# Patient Record
Sex: Female | Born: 1945 | Race: White | Hispanic: No | Marital: Married | Smoking: Former smoker
Health system: Southern US, Community
[De-identification: ages and names within clinical notes are randomized; demographics above are authoritative.]

## PROBLEM LIST (undated history)

## (undated) DIAGNOSIS — F32A Depression, unspecified: Secondary | ICD-10-CM

## (undated) DIAGNOSIS — G309 Alzheimer's disease, unspecified: Secondary | ICD-10-CM

## (undated) DIAGNOSIS — F329 Major depressive disorder, single episode, unspecified: Secondary | ICD-10-CM

## (undated) DIAGNOSIS — E1149 Type 2 diabetes mellitus with other diabetic neurological complication: Secondary | ICD-10-CM

## (undated) DIAGNOSIS — M19049 Primary osteoarthritis, unspecified hand: Secondary | ICD-10-CM

## (undated) DIAGNOSIS — E559 Vitamin D deficiency, unspecified: Secondary | ICD-10-CM

## (undated) DIAGNOSIS — E049 Nontoxic goiter, unspecified: Secondary | ICD-10-CM

## (undated) DIAGNOSIS — E785 Hyperlipidemia, unspecified: Secondary | ICD-10-CM

## (undated) DIAGNOSIS — F0280 Dementia in other diseases classified elsewhere without behavioral disturbance: Secondary | ICD-10-CM

## (undated) DIAGNOSIS — E1129 Type 2 diabetes mellitus with other diabetic kidney complication: Secondary | ICD-10-CM

## (undated) DIAGNOSIS — I10 Essential (primary) hypertension: Secondary | ICD-10-CM

## (undated) HISTORY — DX: Vitamin D deficiency, unspecified: E55.9

## (undated) HISTORY — DX: Type 2 diabetes mellitus with other diabetic neurological complication: E11.49

## (undated) HISTORY — DX: Hyperlipidemia, unspecified: E78.5

## (undated) HISTORY — DX: Type 2 diabetes mellitus with other diabetic kidney complication: E11.29

## (undated) HISTORY — DX: Alzheimer's disease, unspecified: G30.9

## (undated) HISTORY — DX: Primary osteoarthritis, unspecified hand: M19.049

## (undated) HISTORY — PX: TONSILLECTOMY: SUR1361

## (undated) HISTORY — DX: Depression, unspecified: F32.A

## (undated) HISTORY — DX: Dementia in other diseases classified elsewhere, unspecified severity, without behavioral disturbance, psychotic disturbance, mood disturbance, and anxiety: F02.80

## (undated) HISTORY — PX: CARDIAC CATHETERIZATION: SHX172

## (undated) HISTORY — DX: Nontoxic goiter, unspecified: E04.9

## (undated) HISTORY — DX: Essential (primary) hypertension: I10

## (undated) HISTORY — DX: Major depressive disorder, single episode, unspecified: F32.9

---

## 2012-05-25 ENCOUNTER — Ambulatory Visit: Payer: Self-pay | Admitting: Nephrology

## 2012-08-15 ENCOUNTER — Ambulatory Visit: Payer: Self-pay | Admitting: Internal Medicine

## 2012-08-25 ENCOUNTER — Ambulatory Visit (INDEPENDENT_AMBULATORY_CARE_PROVIDER_SITE_OTHER): Payer: 59 | Admitting: Internal Medicine

## 2012-08-25 ENCOUNTER — Encounter: Payer: Self-pay | Admitting: Internal Medicine

## 2012-08-25 VITALS — BP 140/78 | HR 60 | Temp 98.0°F | Ht 65.0 in | Wt 211.0 lb

## 2012-08-25 DIAGNOSIS — G309 Alzheimer's disease, unspecified: Secondary | ICD-10-CM

## 2012-08-25 DIAGNOSIS — E11319 Type 2 diabetes mellitus with unspecified diabetic retinopathy without macular edema: Secondary | ICD-10-CM | POA: Insufficient documentation

## 2012-08-25 DIAGNOSIS — E049 Nontoxic goiter, unspecified: Secondary | ICD-10-CM

## 2012-08-25 DIAGNOSIS — F0280 Dementia in other diseases classified elsewhere without behavioral disturbance: Secondary | ICD-10-CM | POA: Insufficient documentation

## 2012-08-25 DIAGNOSIS — E785 Hyperlipidemia, unspecified: Secondary | ICD-10-CM

## 2012-08-25 DIAGNOSIS — N058 Unspecified nephritic syndrome with other morphologic changes: Secondary | ICD-10-CM

## 2012-08-25 DIAGNOSIS — I10 Essential (primary) hypertension: Secondary | ICD-10-CM | POA: Insufficient documentation

## 2012-08-25 DIAGNOSIS — E1121 Type 2 diabetes mellitus with diabetic nephropathy: Secondary | ICD-10-CM

## 2012-08-25 DIAGNOSIS — E1139 Type 2 diabetes mellitus with other diabetic ophthalmic complication: Secondary | ICD-10-CM

## 2012-08-25 DIAGNOSIS — F329 Major depressive disorder, single episode, unspecified: Secondary | ICD-10-CM

## 2012-08-25 DIAGNOSIS — M19049 Primary osteoarthritis, unspecified hand: Secondary | ICD-10-CM | POA: Insufficient documentation

## 2012-08-25 DIAGNOSIS — E1129 Type 2 diabetes mellitus with other diabetic kidney complication: Secondary | ICD-10-CM

## 2012-08-25 NOTE — Progress Notes (Signed)
Subjective:    Patient ID: Sara Gilbert, female    DOB: October 04, 1945, 66 y.o.   MRN: [id number]  HPI Here with daughter Smith Robert from this area--moved to [place] for husband's job in 2001 He is retiring soon and they are moving back to this area Living with daughter  Has history of dementia--goes back 3 years Has had gradual progression--- sounds like classic Alzheimer's Going to [place] every other Thursday and every Monday Daughter lays out the clothes for her, needs prompting for cleaning and personal care. Hands on help for shower. Will use toilet independently but is incontinent at times of urine so uses diapers  Depression is an issue for some time Predated the dementia issues but had started with counselors and meds. Off prozac in may when daughter got involved. Seems not much better since back on it [place], [place], family Only intermittent depressed mood---just "feeling blue"  Diabetes goes back about 20 years Insulin since early in course-- 1996 Has had some hypoglycemia reactions--endocrinologist wanted her to take rapid acting insulin in evening Control has been okay as far as they know Diabetic retinopathy ~1998. Needed laser surgery Past foot ulcer---has healed  Known hypercholesterolemia High blood pressure also for many years Patient notes some vague history of CHF---but this has not been active Dr Clarene Duke did stress test in June---was negative  Diagnosed with goiter Had biopsy in past No meds for this  Current Outpatient Prescriptions on File Prior to Visit  Medication Sig Dispense Refill  . amLODipine-benazepril (LOTREL) 10-40 MG per capsule Take 1 capsule by mouth daily.      . Calcium Carbonate-Vitamin D (CALCIUM 600+D3 PO) Take by mouth daily.      . colesevelam (WELCHOL) 625 MG tablet Take 3,750 mg by mouth daily.      Marland Kitchen donepezil (ARICEPT) 10 MG tablet Take 10 mg by mouth at bedtime as needed.      . fenofibrate (TRICOR) 145 MG tablet  Take 145 mg by mouth daily.      . Ferrous Sulfate 140 (45 FE) MG TBCR Take 1 tablet by mouth daily.      Marland Kitchen FLUoxetine (PROZAC) 40 MG capsule Take 40 mg by mouth daily.      . insulin glargine (LANTUS) 100 UNIT/ML injection Inject 40 Units into the skin at bedtime.      . Memantine HCl ER (NAMENDA XR) 28 MG CP24 Take 1 capsule by mouth daily.      . metFORMIN (GLUCOPHAGE) 1000 MG tablet Take 1,000 mg by mouth 2 (two) times daily with a meal.      . pravastatin (PRAVACHOL) 40 MG tablet Take 40 mg by mouth daily.        Allergies  Allergen Reactions  . Tetanus Toxoids Swelling and Rash    Past Medical History  Diagnosis Date  . Type II or unspecified type diabetes mellitus with neurological manifestations, not stated as uncontrolled(250.60)   . Hypertension   . Type II or unspecified type diabetes mellitus with renal manifestations, not stated as uncontrolled(250.40)   . Hyperlipidemia   . Alzheimer's dementia 2010?  Marland Kitchen Goiter     Past Surgical History  Procedure Date  . Tonsillectomy     Family History  Problem Relation Age of Onset  . Dementia Mother   . Breast cancer Mother   . Hypertension Mother   . Coronary artery disease Father   . Hypertension Father   . Diabetes Father     History  Social History  . Marital Status: Married    Spouse Name: N/A    Number of Children: 1  . Years of Education: N/A   Occupational History  . 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 Smoking status: Former Smoker    Types: Cigarettes    Quit date: 09/21/1997  . Smokeless tobacco: Never Used  . Alcohol Use: No  . Drug Use: No  . Sexually Active: Not on file   Other Topics Concern  . Not on file   Social History Narrative   Lives with her daughterNo living willNo specific health care POA---but requests husband then daughterWould accept resuscitation attempts   Review of Systems  Constitutional: Negative for appetite change and unexpected weight  change.  HENT: Negative for hearing loss, dental problem and tinnitus.        Own teeth--regular with dentist  Eyes: Negative for photophobia and visual disturbance.  Respiratory: Negative for cough, chest tightness and shortness of breath.   Cardiovascular: Positive for leg swelling. Negative for chest pain and palpitations.       Some left ankle swelling  Gastrointestinal: Positive for blood in stool. Negative for nausea, vomiting and constipation.       Some heartburn--uses baking soda as needed. Not lately Some bright red blood after incontinent stool about 3 weeks ago---might have been from hemorrhoids(no recurrence)  Genitourinary: Negative for dysuria, urgency, frequency and difficulty urinating.  Musculoskeletal: Positive for arthralgias. Negative for back pain and joint swelling.       Arthritis in hands--no meds  Skin: Negative for rash.       No suspicious lesions but big moles on back  Neurological: Positive for headaches. Negative for dizziness, syncope and light-headedness.       Occ headaches  Hematological: Negative for adenopathy. Does not bruise/bleed easily.  Psychiatric/Behavioral: Positive for dysphoric mood and decreased concentration. Negative for sleep disturbance. The patient is nervous/anxious.        Objective:   Physical Exam  Constitutional: She appears well-developed and well-nourished. No distress.  Neck: Normal range of motion. Neck supple. No thyromegaly present.  Cardiovascular: Normal rate, regular rhythm, normal heart sounds and intact distal pulses.  Exam reveals no gallop.   No murmur heard. Pulmonary/Chest: Effort normal and breath sounds normal. No respiratory distress. She has no wheezes. She has no rales.  Abdominal: Soft. There is no tenderness.  Musculoskeletal: She exhibits no tenderness.       Trace ankle edema  Lymphadenopathy:    She has no cervical adenopathy.  Neurological: She exhibits normal muscle tone.       No focal weakness   Skin:       Some irritation under abdominal pannus  Psychiatric: She has a normal mood and affect. Her behavior is normal.          Assessment & Plan:

## 2012-08-25 NOTE — Assessment & Plan Note (Signed)
Will continue statin but stop other meds (no clear evidence of positive effect)

## 2012-08-25 NOTE — Assessment & Plan Note (Signed)
Mood is not consistently bad Not anhedonic Will continue the fluoxetine

## 2012-08-25 NOTE — Assessment & Plan Note (Signed)
Seeing Dr Wynelle Link

## 2012-08-25 NOTE — Assessment & Plan Note (Signed)
Will check labs Stop humalog due to hypoglycemia

## 2012-08-25 NOTE — Assessment & Plan Note (Signed)
BP Readings from Last 3 Encounters:  08/25/12 140/78   Good control No changes Will leave met b and CBC checks to Dr Wynelle Link

## 2012-08-25 NOTE — Assessment & Plan Note (Signed)
Mild to moderate Unsure how much help the meds are Will continue for now

## 2012-08-26 LAB — LIPID PANEL
HDL: 49.7 mg/dL (ref >39.00)
LDL Cholesterol: 94 mg/dL (ref 0–99)
Total CHOL/HDL Ratio: 3
VLDL: 28.4 mg/dL (ref 0.0–40.0)

## 2012-08-26 LAB — HEPATIC FUNCTION PANEL
ALT: 22 U/L (ref 0–35)
AST: 20 U/L (ref 0–37)
Albumin: 2.8 g/dL — ABNORMAL LOW (ref 3.5–5.2)
Alkaline Phosphatase: 50 U/L (ref 39–117)
Bilirubin, Direct: 0.1 mg/dL (ref 0.0–0.3)
Total Bilirubin: 0.4 mg/dL (ref 0.3–1.2)
Total Protein: 6.2 g/dL (ref 6.0–8.3)

## 2012-08-26 LAB — T4, FREE: Free T4: 0.92 ng/dL (ref 0.60–1.60)

## 2012-08-31 ENCOUNTER — Encounter: Payer: Self-pay | Admitting: *Deleted

## 2012-09-05 ENCOUNTER — Other Ambulatory Visit: Payer: Self-pay | Admitting: Internal Medicine

## 2012-09-13 ENCOUNTER — Encounter: Payer: Self-pay | Admitting: Internal Medicine

## 2012-10-10 ENCOUNTER — Emergency Department: Payer: Self-pay | Admitting: Unknown Physician Specialty

## 2012-10-10 LAB — CBC
HCT: 42.6 % (ref 35.0–47.0)
HGB: 14.1 g/dL (ref 12.0–16.0)
MCHC: 33.1 g/dL (ref 32.0–36.0)
Platelet: 252 10*3/uL (ref 150–440)
RBC: 4.77 10*6/uL (ref 3.80–5.20)
RDW: 13.2 % (ref 11.5–14.5)
WBC: 9.5 10*3/uL (ref 3.6–11.0)

## 2012-10-10 LAB — COMPREHENSIVE METABOLIC PANEL
Albumin: 2.7 g/dL — ABNORMAL LOW (ref 3.4–5.0)
Anion Gap: 5 — ABNORMAL LOW (ref 7–16)
BUN: 27 mg/dL — ABNORMAL HIGH (ref 7–18)
Calcium, Total: 9.4 mg/dL (ref 8.5–10.1)
Co2: 32 mmol/L (ref 21–32)
EGFR (African American): 54 — ABNORMAL LOW
EGFR (Non-African Amer.): 47 — ABNORMAL LOW
Glucose: 134 mg/dL — ABNORMAL HIGH (ref 65–99)
Potassium: 4.3 mmol/L (ref 3.5–5.1)
SGPT (ALT): 21 U/L (ref 12–78)
Sodium: 143 mmol/L (ref 136–145)

## 2012-10-10 LAB — URINALYSIS, COMPLETE
Glucose,UR: >=500 mg/dL (ref 0–75)
Hyaline Cast: 3
Ketone: NEGATIVE
Nitrite: NEGATIVE
RBC,UR: 3 /HPF (ref 0–5)
Specific Gravity: 1.02 (ref 1.003–1.030)
WBC UR: 6 /HPF (ref 0–5)

## 2012-10-10 LAB — TSH: Thyroid Stimulating Horm: 3.43 u[IU]/mL

## 2012-10-10 LAB — TROPONIN I: Troponin-I: <0.02 ng/mL

## 2012-10-10 LAB — RAPID INFLUENZA A&B ANTIGENS

## 2012-10-25 ENCOUNTER — Telehealth: Payer: Self-pay

## 2012-10-25 NOTE — Telephone Encounter (Signed)
Sara Gilbert with [facility] left v/m;pts daughter,Adrian request hospice services for pt. Pt has appt to see Dr Alphonsus Sias on 10/26/12. If Dr Alphonsus Sias is agreeable please fax order for hospice care, demographics and 10/26/12 office note.

## 2012-10-25 NOTE — Telephone Encounter (Signed)
I will discuss this with them tomorrow

## 2012-10-26 ENCOUNTER — Other Ambulatory Visit: Payer: Self-pay | Admitting: *Deleted

## 2012-10-26 ENCOUNTER — Encounter: Payer: Self-pay | Admitting: Internal Medicine

## 2012-10-26 ENCOUNTER — Ambulatory Visit (INDEPENDENT_AMBULATORY_CARE_PROVIDER_SITE_OTHER): Payer: Medicare Other | Admitting: Internal Medicine

## 2012-10-26 VITALS — BP 130/70 | HR 55 | Temp 98.3°F | Wt 198.0 lb

## 2012-10-26 DIAGNOSIS — G309 Alzheimer's disease, unspecified: Secondary | ICD-10-CM

## 2012-10-26 DIAGNOSIS — F0280 Dementia in other diseases classified elsewhere without behavioral disturbance: Secondary | ICD-10-CM

## 2012-10-26 DIAGNOSIS — F329 Major depressive disorder, single episode, unspecified: Secondary | ICD-10-CM

## 2012-10-26 DIAGNOSIS — E1129 Type 2 diabetes mellitus with other diabetic kidney complication: Secondary | ICD-10-CM

## 2012-10-26 DIAGNOSIS — I10 Essential (primary) hypertension: Secondary | ICD-10-CM

## 2012-10-26 MED ORDER — BLOOD GLUCOSE TEST VI STRP: ORAL_STRIP | Status: DC

## 2012-10-26 MED ORDER — METFORMIN HCL 1000 MG PO TABS: 1000.0000 mg | ORAL_TABLET | Freq: Two times a day (BID) | ORAL | Status: DC

## 2012-10-26 MED ORDER — GLUCOSE BLOOD VI STRP: ORAL_STRIP | Status: DC

## 2012-10-26 MED ORDER — INSULIN GLARGINE 100 UNIT/ML ~~LOC~~ SOLN: 40.0000 [IU] | Freq: Every day | SUBCUTANEOUS | Status: DC

## 2012-10-26 NOTE — Progress Notes (Signed)
Subjective:    Patient ID: Sara Gilbert, female    DOB: 1945/11/06, 67 y.o.   MRN: [id number]  HPI Here with husband and daughter  Called rescue 2 days ago "legs were jello"--unable to stand up Was able to use walker 3 days ago--then worsened  Didn't want to go to ER---did walk unstably with walker when EMTs there Not using walker correctly Now in wheelchair  Has had overall decline in status Not as responsive --"I don't know" is the answer to everything Uncontrolled drooling Now incontinent of bowel and bladder--rarely successful on bedside cammode Needs to be dressed and bathed now  Not clearly depressed Sleeping okay  No obvious pain but vague "not feeling well" Pointing to leg yesterday as possibly painful  Aide checking BP regularly 120/70 to 197/79 Many are 170-180 systolic  Current Outpatient Prescriptions on File Prior to Visit  Medication Sig Dispense Refill  . amLODipine-benazepril (LOTREL) 10-40 MG per capsule Take 1 capsule by mouth daily.      Marland Kitchen aspirin 81 MG tablet Take 81 mg by mouth daily.      . Calcium Carbonate-Vitamin D (CALCIUM 600+D3 PO) Take by mouth daily.      . Cholecalciferol (VITAMIN D3) 2000 UNITS capsule Take 2,000 Units by mouth daily.      Marland Kitchen donepezil (ARICEPT) 10 MG tablet TAKE 2 TABLETS BY MOUTH EVERY DAY  60 tablet  2  . Ferrous Sulfate 140 (45 FE) MG TBCR Take 1 tablet by mouth daily.      . fish oil-omega-3 fatty acids 1000 MG capsule Take 1 g by mouth daily.      Marland Kitchen FLUoxetine (PROZAC) 40 MG capsule Take 40 mg by mouth daily.      . insulin glargine (LANTUS) 100 UNIT/ML injection Inject 40 Units into the skin at bedtime.  10 mL  6  . metFORMIN (GLUCOPHAGE) 1000 MG tablet Take 1 tablet (1,000 mg total) by mouth 2 (two) times daily with a meal.  60 tablet  11  . Multiple Vitamin (MULTIVITAMIN) tablet Take 1 tablet by mouth daily.      Marland Kitchen NAMENDA XR 28 MG CP24 TAKE 1 TABLET BY MOUTH EVERY DAY  30 capsule  2  . pravastatin (PRAVACHOL)  40 MG tablet Take 40 mg by mouth daily.      . Vitamin D, Ergocalciferol, (DRISDOL) 50000 UNITS CAPS Take 50,000 Units by mouth every 30 (thirty) days.        Allergies  Allergen Reactions  . Tetanus Toxoids Swelling and Rash    Past Medical History  Diagnosis Date  . Type II or unspecified type diabetes mellitus with neurological manifestations, not stated as uncontrolled(250.60)   . Hypertension   . Type II or unspecified type diabetes mellitus with renal manifestations, not stated as uncontrolled(250.40)   . Hyperlipidemia   . Alzheimer's dementia 2010?  Marland Kitchen Goiter   . Depression   . Osteoarthritis, hand   . Vitamin D deficiency     Normal DEXA 11/09    Past Surgical History  Procedure Date  . Tonsillectomy     Family History  Problem Relation Age of Onset  . Dementia Mother   . Breast cancer Mother   . Hypertension Mother   . Coronary artery disease Father   . Hypertension Father   . Diabetes Father     History   Social History  . Marital Status: Married    Spouse Name: N/A    Number of Children: 1  .  Years of Education: N/A   Occupational History  . 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 Smoking status: Former Smoker    Types: Cigarettes    Quit date: 09/21/1997  . Smokeless tobacco: Never Used  . Alcohol Use: No  . Drug Use: No  . Sexually Active: Not on file   Other Topics Concern  . Not on file   Social History Narrative   Lives with her daughterNo living willNo specific health care POA---but requests husband then daughterWould accept resuscitation attempts   Review of Systems Not eating--even with direct feeding efforts Has lost 13# in just 2 months    Objective:   Physical Exam  Constitutional: No distress.       Clearly seems to have some wasting   Neck: Normal range of motion. Neck supple. No thyromegaly present.  Cardiovascular: Normal rate, regular rhythm and normal heart sounds.  Exam reveals no  gallop.   No murmur heard. Pulmonary/Chest: Effort normal and breath sounds normal. No respiratory distress. She has no wheezes. She has no rales.  Abdominal: Soft. There is no tenderness.  Lymphadenopathy:    She has no cervical adenopathy.  Neurological:       Distant Does respond with single word answers Equal strength in arms (4/5) and legs (3+/5) Normal tone Did have some saliva  Psychiatric:       Passive and some withdrawn          Assessment & Plan:

## 2012-10-26 NOTE — Assessment & Plan Note (Addendum)
Sugars okay No hypoglycemic reactions Will  cut back on insulin to 30

## 2012-10-26 NOTE — Assessment & Plan Note (Signed)
Hard to judge Will continue the fluoxetine

## 2012-10-26 NOTE — Assessment & Plan Note (Signed)
High by aide at home but not here BP Readings from Last 3 Encounters:  10/26/12 130/70  08/25/12 140/78   No changes for now

## 2012-10-26 NOTE — Assessment & Plan Note (Addendum)
Has had striking decline with loss of 10% of body weight Has lost all ADLs and totally dependent With early onset, this suggests rapid progression and likely death within 6 months Will ask hospice to see  Should get PT attempt at least Consider stopping donepezil

## 2012-10-27 ENCOUNTER — Telehealth: Payer: Self-pay | Admitting: *Deleted

## 2012-10-27 MED ORDER — GLUCOSE BLOOD VI STRP: ORAL_STRIP | Status: DC

## 2012-10-27 MED ORDER — INSULIN GLARGINE 100 UNIT/ML ~~LOC~~ SOLN: 30.0000 [IU] | Freq: Every day | SUBCUTANEOUS | Status: DC

## 2012-10-27 NOTE — Telephone Encounter (Signed)
Received fax from pharmacy stating that pt uses lantus solostar and not the vial, I need to know if they also need pen needles and what size?  Called daughter and left VM at 11:44am asking her to return my call Just called and left message for husband to return my call

## 2012-10-27 NOTE — Telephone Encounter (Signed)
Spoke with husband and corrected pt's rx  rx sent to pharmacy by e-script

## 2012-10-27 NOTE — Telephone Encounter (Signed)
pts husband left v/m returning call and request call back [phone].

## 2012-10-27 NOTE — Telephone Encounter (Signed)
Message copied by Sueanne Margarita on Thu Oct 27, 2012  3:28 PM ------      Message from: Patience Musca      Created: Thu Oct 27, 2012  2:38 PM       Pt's husband, Leonette Most left v/m returning Dee's call and requesting call back [phone].

## 2012-11-11 DIAGNOSIS — G309 Alzheimer's disease, unspecified: Secondary | ICD-10-CM

## 2012-11-11 DIAGNOSIS — F0280 Dementia in other diseases classified elsewhere without behavioral disturbance: Secondary | ICD-10-CM

## 2012-11-23 ENCOUNTER — Ambulatory Visit: Payer: Medicare Other | Admitting: Internal Medicine

## 2012-11-23 ENCOUNTER — Encounter: Payer: Self-pay | Admitting: Internal Medicine

## 2012-11-23 VITALS — BP 144/72 | HR 60 | Resp 14

## 2012-11-23 DIAGNOSIS — I10 Essential (primary) hypertension: Secondary | ICD-10-CM

## 2012-11-23 DIAGNOSIS — E1129 Type 2 diabetes mellitus with other diabetic kidney complication: Secondary | ICD-10-CM

## 2012-11-23 NOTE — Assessment & Plan Note (Signed)
The fluctuations in her status---better now--- and history of MRI lesions really does point to vascular or at least mixed dementia (daughter had indicated slow steady decline at my first visit) May not need meds Daughter, hospice RN will review with husband Can try serially try stopping the donepezil, then memantine, if he agrees Continue with trying the PT If her status stabilizes, may need to discharge from hospice

## 2012-11-23 NOTE — Progress Notes (Signed)
Subjective:    Patient ID: Sara Gilbert, female    DOB: [birth year], 67 y.o.   MRN: [id number]  HPI 1st home visit Daughter is here---friend is staying with her while husband in [place] RN is here from hospice  Variable status Today she is talking some but at times she is in bed and non communicative Daughter thinks she may be better in response to friend being here In wheelchair Has started some PT this week--has been able to walk some but very variable Totally incontinent--they do bring her to the bathroom regularly and this is occasionally successful (she never initiates) Feeds herself at times---family need to help much of the time  Checks sugars three to  four times per day Family has used this to help gauge how to feed her No apparent hypoglycemic reactions Highest was 250  No chest pain No SOB No syncope  Still has right hand tremor Reviewed Dr Daisy Blossom note  Current Outpatient Prescriptions on File Prior to Visit  Medication Sig Dispense Refill  . amLODipine-benazepril (LOTREL) 10-40 MG per capsule Take 1 capsule by mouth daily.      Marland Kitchen donepezil (ARICEPT) 10 MG tablet TAKE 2 TABLETS BY MOUTH EVERY DAY  60 tablet  2  . Ferrous Sulfate 140 (45 FE) MG TBCR Take 1 tablet by mouth daily.      . fish oil-omega-3 fatty acids 1000 MG capsule Take 1 g by mouth daily.      Marland Kitchen FLUoxetine (PROZAC) 40 MG capsule Take 40 mg by mouth daily.      Marland Kitchen glucose blood (FORA V12 BLOOD GLUCOSE TEST) test strip Use as directed to test blood sugar once daily dx 250.40  100 each  0  . insulin glargine (LANTUS SOLOSTAR) 100 UNIT/ML injection Inject 30 Units into the skin at bedtime. Dx 250.40  5 pen  11  . metFORMIN (GLUCOPHAGE) 1000 MG tablet Take 1 tablet (1,000 mg total) by mouth 2 (two) times daily with a meal.  60 tablet  11  . Multiple Vitamin (MULTIVITAMIN) tablet Take 1 tablet by mouth daily.      Marland Kitchen NAMENDA XR 28 MG CP24 TAKE 1 TABLET BY MOUTH EVERY DAY  30 capsule  2  .  pravastatin (PRAVACHOL) 40 MG tablet Take 40 mg by mouth daily.      . Vitamin D, Ergocalciferol, (DRISDOL) 50000 UNITS CAPS Take 50,000 Units by mouth every 30 (thirty) days.       No current facility-administered medications on file prior to visit.    Allergies  Allergen Reactions  . Tetanus Toxoids Swelling and Rash    Past Medical History  Diagnosis Date  . Type II or unspecified type diabetes mellitus with neurological manifestations, not stated as uncontrolled(250.60)   . Hypertension   . Type II or unspecified type diabetes mellitus with renal manifestations, not stated as uncontrolled(250.40)   . Hyperlipidemia   . Alzheimer's dementia 2010?  Marland Kitchen Goiter   . Depression   . Osteoarthritis, hand   . Vitamin D deficiency     Normal DEXA 11/09    Past Surgical History  Procedure Laterality Date  . Tonsillectomy      Family History  Problem Relation Age of Onset  . Dementia Mother   . Breast cancer Mother   . Hypertension Mother   . Coronary artery disease Father   . Hypertension Father   . Diabetes Father     History   Social History  . Marital Status: Married  Spouse Name: N/A    Number of Children: 1  . Years of Education: N/A   Occupational History  . 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 Smoking status: Former Smoker    Types: Cigarettes    Quit date: 09/21/1997  . Smokeless tobacco: Never Used  . Alcohol Use: No  . Drug Use: No  . Sexually Active: Not on file   Other Topics Concern  . Not on file   Social History Narrative   Lives with her daughter   No living will   No specific health care POA---but requests husband then daughter   Would accept resuscitation attempts   Review of Systems Appetite fair if she is fed---not much if she feeds herself Sleeps okay No mood problems---crying, lability, etc    Objective:   Physical Exam  Constitutional: She appears well-developed and well-nourished. No distress.   Neck: Normal range of motion. No thyromegaly present.  Cardiovascular: Normal rate, regular rhythm, normal heart sounds and intact distal pulses.  Exam reveals no gallop.   No murmur heard. Pulmonary/Chest: Effort normal and breath sounds normal. No respiratory distress. She has no wheezes. She has no rales.  Abdominal: Soft. There is no tenderness.  Lymphadenopathy:    She has no cervical adenopathy.  Neurological:  Does have brief socially appropriate answers  Skin:  No foot ulcers   Psychiatric: She has a normal mood and affect. Her behavior is normal.          Assessment & Plan:

## 2012-11-23 NOTE — Assessment & Plan Note (Signed)
Given the possibility that her dementia is all or partially vascular in nature--will continue the statin

## 2012-11-23 NOTE — Assessment & Plan Note (Signed)
Mood seems to be stable May have different behavior for her husband Will continue the med at current dose for now ?could this be related to tremor Will try holding 2 days per week

## 2012-11-23 NOTE — Assessment & Plan Note (Signed)
BP Readings from Last 3 Encounters:  11/23/12 144/72  10/26/12 130/70  08/25/12 140/78   Generally acceptable control No changes needed

## 2012-11-23 NOTE — Assessment & Plan Note (Signed)
Seems to have good control Discussed that they can decrease checking to once or twice a day Lab Results  Component Value Date   HGBA1C 6.4 08/25/2012

## 2012-12-01 ENCOUNTER — Other Ambulatory Visit: Payer: Self-pay | Admitting: Internal Medicine

## 2012-12-24 ENCOUNTER — Other Ambulatory Visit: Payer: Self-pay | Admitting: Internal Medicine

## 2012-12-26 ENCOUNTER — Telehealth: Payer: Self-pay | Admitting: Family Medicine

## 2012-12-26 NOTE — Telephone Encounter (Signed)
Ok to fill? Home visit patient

## 2012-12-26 NOTE — Telephone Encounter (Signed)
Error

## 2012-12-26 NOTE — Telephone Encounter (Signed)
Okay to send for a year 

## 2012-12-26 NOTE — Telephone Encounter (Signed)
rx sent to pharmacy by e-script  

## 2013-01-02 ENCOUNTER — Telehealth: Payer: Self-pay | Admitting: *Deleted

## 2013-01-02 NOTE — Telephone Encounter (Signed)
Prior auth being requested for METOPROLOL TARTRATE 25 MG TAB TAKE 1 TAB BY MOUTH TWICE DAILY, should I send for prior auth form? We have never filled this, please advise

## 2013-01-02 NOTE — Telephone Encounter (Signed)
Yes Very inexpensive though,  Don't know why we have to do request

## 2013-01-10 DIAGNOSIS — E1365 Other specified diabetes mellitus with hyperglycemia: Secondary | ICD-10-CM

## 2013-01-10 DIAGNOSIS — F0280 Dementia in other diseases classified elsewhere without behavioral disturbance: Secondary | ICD-10-CM

## 2013-01-11 ENCOUNTER — Telehealth: Payer: Self-pay | Admitting: Internal Medicine

## 2013-01-11 NOTE — Telephone Encounter (Signed)
No need to be done now

## 2013-01-11 NOTE — Telephone Encounter (Signed)
Discussed with Sara Gilbert Only tinged mucus Will try fluconazole 150mg  daily x 2 1% hydrocortisone cream under the fanny cream they use now   Also, will be moving to the [place] [facility] [place] soon She will let me know

## 2013-01-11 NOTE — Telephone Encounter (Signed)
Caller: [name]/Other; Phone: [phone]; Reason for Call: Hospice is calling regarding thick, mucus discharge found in her pull ups.  Labia reddened.  Denies itching.  Reports there has been a blood tinge to the discharge at times.  Sara Gilbert is wondering if Dr.  Alphonsus Sias would prescribe something for either a UTI or a yeast infection.  Please call her back to discuss patient status.  Thanks.

## 2013-01-13 ENCOUNTER — Telehealth: Payer: Self-pay | Admitting: *Deleted

## 2013-01-13 NOTE — Telephone Encounter (Signed)
Lmtcb pharmacy requests received for metoprolol tartrate 25 mg BID and Amlodipine benazepril 10-40 mg qd. Don't see pt has been seen before.

## 2013-01-16 NOTE — Telephone Encounter (Signed)
x2 lmtcb regarding refill pt never seen here.

## 2013-02-06 ENCOUNTER — Telehealth: Payer: Self-pay | Admitting: Internal Medicine

## 2013-02-06 NOTE — Telephone Encounter (Signed)
Pt's husband called in to let you know the address has changed.  I have updated the file, but wasn't sure when your next home visit is, so I wanted to be sure you were aware. Thanks.

## 2013-02-06 NOTE — Telephone Encounter (Signed)
Thanks The hospice nurse had let me know.  They are at the [facility] apartments--- at [facility]. I know where that is very well

## 2013-02-15 DIAGNOSIS — F0280 Dementia in other diseases classified elsewhere without behavioral disturbance: Secondary | ICD-10-CM

## 2013-02-15 DIAGNOSIS — G309 Alzheimer's disease, unspecified: Secondary | ICD-10-CM

## 2013-02-17 ENCOUNTER — Ambulatory Visit (INDEPENDENT_AMBULATORY_CARE_PROVIDER_SITE_OTHER): Payer: Medicare Other | Admitting: Cardiovascular Disease

## 2013-02-17 ENCOUNTER — Encounter: Payer: Self-pay | Admitting: Cardiovascular Disease

## 2013-02-17 VITALS — BP 122/78 | HR 64 | Ht 66.0 in | Wt 184.3 lb

## 2013-02-17 DIAGNOSIS — E1129 Type 2 diabetes mellitus with other diabetic kidney complication: Secondary | ICD-10-CM

## 2013-02-17 DIAGNOSIS — E785 Hyperlipidemia, unspecified: Secondary | ICD-10-CM

## 2013-02-17 DIAGNOSIS — I10 Essential (primary) hypertension: Secondary | ICD-10-CM

## 2013-02-17 MED ORDER — AMLODIPINE BESY-BENAZEPRIL HCL 10-40 MG PO CAPS: 1.0000 | ORAL_CAPSULE | Freq: Every day | ORAL | Status: DC

## 2013-02-17 MED ORDER — METOPROLOL TARTRATE 25 MG PO TABS: 25.0000 mg | ORAL_TABLET | Freq: Two times a day (BID) | ORAL | Status: DC

## 2013-02-17 MED ORDER — PRAVASTATIN SODIUM 40 MG PO TABS: 40.0000 mg | ORAL_TABLET | Freq: Every day | ORAL | Status: DC

## 2013-02-17 NOTE — Assessment & Plan Note (Signed)
Cholesterol is at goal on the current lipid regimen. No changes to the medications were made. No known coronary artery disease.

## 2013-02-17 NOTE — Assessment & Plan Note (Signed)
Diabetes well controlled. Managed by Dr. Alphonsus Sias.

## 2013-02-17 NOTE — Patient Instructions (Addendum)
You are doing well. No medication changes were made.  Please call us if you have new issues that need to be addressed before your next appt.  Your physician wants you to follow-up in: 12 months.  You will receive a reminder letter in the mail two months in advance. If you don't receive a letter, please call our office to schedule the follow-up appointment. 

## 2013-02-17 NOTE — Progress Notes (Signed)
Patient ID: Sara Gilbert, female    DOB: 28-Feb-1946, 67 y.o.   MRN: [id number]  HPI Comments: Sara Gilbert is a very pleasant 67 year old woman with dementia, smoking history for 25 years who stopped in 1995, long history of diabetes, prior patient of Dr. Mindi Junker little who presents for new patient evaluation.  She presents with her husband. She's not reactive, has significant gait instability secondary to underlying dementia. She's had weight loss over the past year or so as she is eating less. Notes provided indicates she has hypertension and hyperlipidemia.   She had a stress test August 2013 for shortness of breath episodes. This suggested mild ischemia in the mid anterolateral and apical lateral regions. Low risk scan. Unable to exclude breast attenuation artifact. Ejection fraction 71%  Husband reports that she has no recent symptoms that she has expressed to him. She has significant proteinuria, creatinine 0.94 Blood pressure is typically well controlled at home  EKG today shows normal sinus rhythm with rate 64 beats per minute with no significant ST or T wave changes   Outpatient Encounter Prescriptions as of 02/17/2013  Medication Sig Dispense Refill  . amLODipine-benazepril (LOTREL) 10-40 MG per capsule Take 1 capsule by mouth daily.  30 capsule  6  . aspirin 81 MG chewable tablet Chew 81 mg by mouth daily.      . Ferrous Sulfate 140 (45 FE) MG TBCR Take 1 tablet by mouth daily.      . fish oil-omega-3 fatty acids 1000 MG capsule Take 1 g by mouth daily.      Marland Kitchen FLUoxetine (PROZAC) 40 MG capsule Take 40 mg by mouth daily.      Marland Kitchen glucose blood (FORA V12 BLOOD GLUCOSE TEST) test strip Use as directed to test blood sugar once daily dx 250.40  100 each  0  . insulin glargine (LANTUS SOLOSTAR) 100 UNIT/ML injection Inject 30 Units into the skin at bedtime. Dx 250.40  5 pen  11  . metFORMIN (GLUCOPHAGE) 1000 MG tablet Take 1 tablet (1,000 mg total) by mouth 2 (two) times daily with a  meal.  60 tablet  11  . metoprolol tartrate (LOPRESSOR) 25 MG tablet Take 1 tablet (25 mg total) by mouth 2 (two) times daily.  60 tablet  6  . Multiple Vitamin (MULTIVITAMIN) tablet Take 1 tablet by mouth daily.      Marland Kitchen NAMENDA XR 28 MG CP24 TAKE ONE CAPSULE BY MOUTH ONCE A DAY  30 capsule  11  . pravastatin (PRAVACHOL) 40 MG tablet Take 1 tablet (40 mg total) by mouth daily.  30 tablet  6  . prochlorperazine (COMPAZINE) 10 MG tablet Take 10 mg by mouth every 6 (six) hours as needed.      . Vitamin D, Ergocalciferol, (DRISDOL) 50000 UNITS CAPS Take 50,000 Units by mouth every 30 (thirty) days.      . Vitamin D, Ergocalciferol, (DRISDOL) 50000 UNITS CAPS Take 50,000 Units by mouth every 30 (thirty) days.        Review of Systems  Constitutional: Negative.   HENT: Negative.   Eyes: Negative.   Respiratory: Negative.   Cardiovascular: Negative.   Gastrointestinal: Negative.   Musculoskeletal: Negative.   Skin: Negative.   Neurological: Negative.   Psychiatric/Behavioral: Negative.   All other systems reviewed and are negative.    BP 122/78  Pulse 64  Ht 5\' 6"  (1.676 m)  Wt 184 lb 5 oz (83.604 kg)  BMI 29.76 kg/m2  Physical Exam  Nursing  note and vitals reviewed. Constitutional: She is oriented to person, place, and time. She appears well-developed and well-nourished.  HENT:  Head: Normocephalic.  Nose: Nose normal.  Mouth/Throat: Oropharynx is clear and moist.  Eyes: Conjunctivae are normal. Pupils are equal, round, and reactive to light.  Neck: Normal range of motion. Neck supple. No JVD present.  Cardiovascular: Normal rate, regular rhythm, S1 normal, S2 normal, normal heart sounds and intact distal pulses.  Exam reveals no gallop and no friction rub.   No murmur heard. Pulmonary/Chest: Effort normal and breath sounds normal. No respiratory distress. She has no wheezes. She has no rales. She exhibits no tenderness.  Abdominal: Soft. Bowel sounds are normal. She exhibits no  distension. There is no tenderness.  Musculoskeletal: Normal range of motion. She exhibits no edema and no tenderness.  Lymphadenopathy:    She has no cervical adenopathy.  Neurological: She is alert and oriented to person, place, and time. Coordination normal.  Skin: Skin is warm and dry. No rash noted. No erythema.  Psychiatric: She has a normal mood and affect. Her behavior is normal. Judgment and thought content normal.    Assessment and Plan

## 2013-02-17 NOTE — Assessment & Plan Note (Signed)
Blood pressure is well controlled on today's visit. No changes made to the medications. 

## 2013-02-22 ENCOUNTER — Encounter: Payer: Self-pay | Admitting: Internal Medicine

## 2013-02-22 ENCOUNTER — Ambulatory Visit: Payer: Medicare Other | Admitting: Internal Medicine

## 2013-02-22 VITALS — BP 142/72 | HR 66 | Resp 14

## 2013-02-22 DIAGNOSIS — F0280 Dementia in other diseases classified elsewhere without behavioral disturbance: Secondary | ICD-10-CM

## 2013-02-22 DIAGNOSIS — E1129 Type 2 diabetes mellitus with other diabetic kidney complication: Secondary | ICD-10-CM

## 2013-02-22 DIAGNOSIS — E785 Hyperlipidemia, unspecified: Secondary | ICD-10-CM

## 2013-02-22 DIAGNOSIS — F329 Major depressive disorder, single episode, unspecified: Secondary | ICD-10-CM

## 2013-02-22 DIAGNOSIS — G309 Alzheimer's disease, unspecified: Secondary | ICD-10-CM

## 2013-02-22 DIAGNOSIS — I10 Essential (primary) hypertension: Secondary | ICD-10-CM

## 2013-02-22 NOTE — Assessment & Plan Note (Signed)
Has had some hypoglycemia which we must avoid---especially with her dementia Will decrease the lantus to 25 and even consider going down more

## 2013-02-22 NOTE — Assessment & Plan Note (Signed)
BP Readings from Last 3 Encounters:  02/22/13 142/72  02/17/13 122/78  11/23/12 144/72   This is acceptable for her No changes

## 2013-02-22 NOTE — Progress Notes (Signed)
Subjective:    Patient ID: Sara Gilbert, female    DOB: 12-07-1945, 67 y.o.   MRN: [id number]  HPI Husband is here [place] --hospice nurse is here  Recently moved to independent units at [facility] They have adjusted here---same furniture and pictures so she has settled in fine Hospice aide comes in every morning to help with bathing and dressing Friends/family give respite a couple of times a week They enjoy the one meal they get here  Tries to keep to diabetic diet, for the most part, now Checking sugar qAM Usually 90-120 Has had some hypoglycemic reactions---if not careful with eating schedule  Does stand up from chair and husband can usually walk her short distance to the bathroom Uses the wheelchair for going any significant distance Feeds herself some of the time--husband often has to finish since she fatigues Often prefers to eat in the recliner---she is sitting up Husband toilets her on schedule---but still incontinent of bowel and bladder at times Needs help for all personal care  No chest pain apparent No dyspnea No sig edema  Mood seems to be okay Has made the adjustment well Current Outpatient Prescriptions on File Prior to Visit  Medication Sig Dispense Refill  . amLODipine-benazepril (LOTREL) 10-40 MG per capsule Take 1 capsule by mouth daily.  30 capsule  6  . aspirin 81 MG chewable tablet Chew 81 mg by mouth daily.      . Ferrous Sulfate 140 (45 FE) MG TBCR Take 1 tablet by mouth daily.      Marland Kitchen FLUoxetine (PROZAC) 40 MG capsule Take 40 mg by mouth daily.      Marland Kitchen glucose blood (FORA V12 BLOOD GLUCOSE TEST) test strip Use as directed to test blood sugar once daily dx 250.40  100 each  0  . metFORMIN (GLUCOPHAGE) 1000 MG tablet Take 1 tablet (1,000 mg total) by mouth 2 (two) times daily with a meal.  60 tablet  11  . metoprolol tartrate (LOPRESSOR) 25 MG tablet Take 1 tablet (25 mg total) by mouth 2 (two) times daily.  60 tablet  6  . Multiple Vitamin  (MULTIVITAMIN) tablet Take 1 tablet by mouth daily.      Marland Kitchen NAMENDA XR 28 MG CP24 TAKE ONE CAPSULE BY MOUTH ONCE A DAY  30 capsule  11  . pravastatin (PRAVACHOL) 40 MG tablet Take 1 tablet (40 mg total) by mouth daily.  30 tablet  6  . prochlorperazine (COMPAZINE) 10 MG tablet Take 10 mg by mouth every 6 (six) hours as needed.       No current facility-administered medications on file prior to visit.    Allergies  Allergen Reactions  . Tetanus Toxoids Swelling and Rash    Past Medical History  Diagnosis Date  . Type II or unspecified type diabetes mellitus with neurological manifestations, not stated as uncontrolled(250.60)   . Hypertension   . Type II or unspecified type diabetes mellitus with renal manifestations, not stated as uncontrolled(250.40)   . Hyperlipidemia   . Alzheimer's dementia 2010?  Marland Kitchen Goiter   . Depression   . Osteoarthritis, hand   . Vitamin D deficiency     Normal DEXA 11/09    Past Surgical History  Procedure Laterality Date  . Tonsillectomy    . Cardiac catheterization      [facility]    Family History  Problem Relation Age of Onset  . Dementia Mother   . Breast cancer Mother   . Hypertension Mother   .  Coronary artery disease Father   . Hypertension Father   . Diabetes Father     History   Social History  . Marital Status: Married    Spouse Name: N/A    Number of Children: 1  . Years of Education: N/A   Occupational History  . 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 Smoking status: Former Smoker    Types: Cigarettes    Quit date: 09/21/1997  . Smokeless tobacco: Never Used  . Alcohol Use: No  . Drug Use: No  . Sexually Active: Not on file   Other Topics Concern  . Not on file   Social History Narrative   Lives with her daughter   No living will   No specific health care POA---but requests husband then daughter   Would accept resuscitation attempts   Review of Systems Weight up 2# since moving here 3  weeks ago Sleeps okay---8-12 hours per day No arthritis pain    Objective:   Physical Exam  Constitutional: She appears well-developed. No distress.  HENT:  Some drooling---a persistent problem  Neck: Normal range of motion. Neck supple.  Cardiovascular: Normal rate, regular rhythm and normal heart sounds.  Exam reveals no gallop.   No murmur heard. Pulmonary/Chest: Effort normal and breath sounds normal. No respiratory distress. She has no wheezes. She has no rales.  Abdominal: Soft. There is no tenderness.  Musculoskeletal: She exhibits no edema and no tenderness.  Lymphadenopathy:    She has no cervical adenopathy.  Neurological:  Normal tone Some speech Engages fairly well  Psychiatric:  Mood is neutral with appropriate affect          Assessment & Plan:

## 2013-02-22 NOTE — Assessment & Plan Note (Signed)
No real CAD history Will try off the statin

## 2013-02-22 NOTE — Assessment & Plan Note (Signed)
Moderate cognitively but requires pretty much total care Still appropriate for hospice  Will try glycopyrrolate for the drooling Advised to try loratadine instead of the benedryl

## 2013-02-22 NOTE — Assessment & Plan Note (Signed)
Mood is stable Needs to stay on the fluoxetine

## 2013-02-27 ENCOUNTER — Encounter: Payer: Self-pay | Admitting: Internal Medicine

## 2013-03-15 ENCOUNTER — Other Ambulatory Visit: Payer: Self-pay | Admitting: *Deleted

## 2013-03-15 MED ORDER — PRAVASTATIN SODIUM 40 MG PO TABS: 40.0000 mg | ORAL_TABLET | Freq: Every day | ORAL | Status: DC

## 2013-03-15 MED ORDER — AMLODIPINE BESY-BENAZEPRIL HCL 10-40 MG PO CAPS: 1.0000 | ORAL_CAPSULE | Freq: Every day | ORAL | Status: DC

## 2013-03-15 NOTE — Telephone Encounter (Signed)
Refilled Amlodipine-Benazepril and Pravastatin sent to [facility] pharmacy.

## 2013-03-22 ENCOUNTER — Other Ambulatory Visit: Payer: Self-pay | Admitting: Internal Medicine

## 2013-03-23 ENCOUNTER — Other Ambulatory Visit: Payer: Self-pay | Admitting: Internal Medicine

## 2013-04-26 ENCOUNTER — Other Ambulatory Visit: Payer: Self-pay

## 2013-05-10 DIAGNOSIS — F0280 Dementia in other diseases classified elsewhere without behavioral disturbance: Secondary | ICD-10-CM

## 2013-05-10 DIAGNOSIS — G309 Alzheimer's disease, unspecified: Secondary | ICD-10-CM

## 2013-05-24 ENCOUNTER — Ambulatory Visit: Admitting: Internal Medicine

## 2013-05-24 ENCOUNTER — Encounter: Payer: Self-pay | Admitting: Internal Medicine

## 2013-05-24 VITALS — BP 122/70 | HR 82 | Resp 16 | Wt 171.0 lb

## 2013-05-24 DIAGNOSIS — E1121 Type 2 diabetes mellitus with diabetic nephropathy: Secondary | ICD-10-CM

## 2013-05-24 DIAGNOSIS — E1129 Type 2 diabetes mellitus with other diabetic kidney complication: Secondary | ICD-10-CM

## 2013-05-24 DIAGNOSIS — N058 Unspecified nephritic syndrome with other morphologic changes: Secondary | ICD-10-CM

## 2013-05-24 DIAGNOSIS — F0280 Dementia in other diseases classified elsewhere without behavioral disturbance: Secondary | ICD-10-CM

## 2013-05-24 DIAGNOSIS — Z23 Encounter for immunization: Secondary | ICD-10-CM

## 2013-05-24 DIAGNOSIS — F329 Major depressive disorder, single episode, unspecified: Secondary | ICD-10-CM

## 2013-05-24 DIAGNOSIS — I10 Essential (primary) hypertension: Secondary | ICD-10-CM

## 2013-05-24 DIAGNOSIS — E785 Hyperlipidemia, unspecified: Secondary | ICD-10-CM

## 2013-05-24 LAB — HM DIABETES FOOT EXAM

## 2013-05-24 NOTE — Addendum Note (Signed)
Addended by: Sueanne Margarita on: 05/24/2013 04:17 PM   Modules accepted: Orders

## 2013-05-24 NOTE — Assessment & Plan Note (Signed)
Mood has been good Will continue the fluoxetine

## 2013-05-24 NOTE — Assessment & Plan Note (Signed)
Sees Dr Wynelle Link Will check renal with A1c now

## 2013-05-24 NOTE — Assessment & Plan Note (Signed)
Given her other diagnoses, will continue the statin for now

## 2013-05-24 NOTE — Assessment & Plan Note (Signed)
No recent hypoglycemia with reduced lantus Will check A1c Goal is just to be under 9%

## 2013-05-24 NOTE — Progress Notes (Signed)
Subjective:    Patient ID: Sara Gilbert, female    DOB: [birth year], 67 y.o.   MRN: [id number]  HPI Husband is here [place] --hospice nurse also here  Overall has been stable Daily CNA to bathe and dress her (Mon-Fri)---from hospice Daily meal from [facility] Husband takes her out to eat---transfers via wheelchair into his car Hasn't been eating as much---weight down 13# over 4 months  Checks sugars in the AM--rarely otherwise No recent hypoglycemia 150-170 fasting  Still walks with walker the short distance to the bathroom sometimes (despite our concerns for him)---he has to support her Otherwise she transfers via wheelchair Despite attempts at toileting-- she is mostly incontinent Starts to eat on her own---husband has to finish  Mood has been okay Doesn't appear to be depressed No major agitation or restlessness Seems to be more confused about things though  No chest pain No breathing problems  Current Outpatient Prescriptions on File Prior to Visit  Medication Sig Dispense Refill  . amLODipine-benazepril (LOTREL) 10-40 MG per capsule Take 1 capsule by mouth daily.  90 capsule  3  . aspirin 81 MG chewable tablet Chew 81 mg by mouth daily.      . cholecalciferol (VITAMIN D) 1000 UNITS tablet Take 1,000 Units by mouth daily.      . Ferrous Sulfate 140 (45 FE) MG TBCR Take 1 tablet by mouth daily.      Marland Kitchen FLUoxetine (PROZAC) 40 MG capsule Take 40 mg by mouth daily.      . Infant Care Products (DERMACLOUD EX) Apply 1 application topically 3 (three) times daily as needed.      . insulin glargine (LANTUS) 100 UNIT/ML injection Inject 26 Units into the skin at bedtime. Dx 250.40      . metFORMIN (GLUCOPHAGE) 1000 MG tablet TAKE 1 TABLET BY MOUTH TWICE A DAY  180 tablet  1  . metoprolol tartrate (LOPRESSOR) 25 MG tablet Take 1 tablet (25 mg total) by mouth 2 (two) times daily.  60 tablet  6  . Multiple Vitamin (MULTIVITAMIN) tablet Take 1 tablet by mouth daily.      Marland Kitchen NAMENDA  XR 28 MG CP24 TAKE ONE CAPSULE BY MOUTH ONCE A DAY  30 capsule  11  . ONE TOUCH ULTRA TEST test strip USE AS DIRECTED TEST BLOOD SUGAR DAILY  100 each  0  . pravastatin (PRAVACHOL) 40 MG tablet Take 1 tablet (40 mg total) by mouth daily.  90 tablet  3  . prochlorperazine (COMPAZINE) 10 MG tablet Take 10 mg by mouth every 6 (six) hours as needed.       No current facility-administered medications on file prior to visit.    Allergies  Allergen Reactions  . Tetanus Toxoids Swelling and Rash    Past Medical History  Diagnosis Date  . Type II or unspecified type diabetes mellitus with neurological manifestations, not stated as uncontrolled(250.60)   . Hypertension   . Type II or unspecified type diabetes mellitus with renal manifestations, not stated as uncontrolled(250.40)   . Hyperlipidemia   . Alzheimer's dementia 2010?  Marland Kitchen Goiter   . Depression   . Osteoarthritis, hand   . Vitamin D deficiency     Normal DEXA 11/09    Past Surgical History  Procedure Laterality Date  . Tonsillectomy    . Cardiac catheterization      [facility]    Family History  Problem Relation Age of Onset  . Dementia Mother   . Breast cancer Mother   .  Hypertension Mother   . Coronary artery disease Father   . Hypertension Father   . Diabetes Father     History   Social History  . Marital Status: Married    Spouse Name: N/A    Number of Children: 1  . Years of Education: N/A   Occupational History  . 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 Smoking status: Former Smoker    Types: Cigarettes    Quit date: 09/21/1997  . Smokeless tobacco: Never Used  . Alcohol Use: No  . Drug Use: No  . Sexual Activity: Not on file   Other Topics Concern  . Not on file   Social History Narrative   Lives with her daughter   No living will   No specific health care POA---but requests husband then daughter   Would accept resuscitation attempts   Review of Systems Robinul  didn't help secretions so stopped Sleeps fairly well--no diphenhydramine  Likes to sleep and sleep in late (9PM- 9-10AM) Had stage 1 red area on buttock---healed with the dermacloud    Objective:   Physical Exam  Constitutional: She appears well-developed. No distress.  Neck: Normal range of motion. Neck supple. No thyromegaly present.  Cardiovascular: Normal rate, regular rhythm, normal heart sounds and intact distal pulses.  Exam reveals no gallop.   No murmur heard. Pulmonary/Chest: Effort normal and breath sounds normal. No respiratory distress. She has no wheezes. She has no rales.  Abdominal: Soft. There is no tenderness.  Musculoskeletal: She exhibits no edema and no tenderness.  Lymphadenopathy:    She has no cervical adenopathy.  Neurological:  Engages Several 1-2 word answers  Skin: No rash noted. No erythema.  No foot lesions  Psychiatric: She has a normal mood and affect. Her behavior is normal.          Assessment & Plan:

## 2013-05-24 NOTE — Assessment & Plan Note (Signed)
Severe but no major change Requires total care Still walks very briefly with walker and sig contact guard---again discussed with husband that this may not be safe Continues on hospice

## 2013-05-24 NOTE — Assessment & Plan Note (Signed)
BP Readings from Last 3 Encounters:  05/24/13 122/70  02/22/13 142/72  02/17/13 122/78   Good control No changes needed

## 2013-05-31 ENCOUNTER — Encounter: Payer: Self-pay | Admitting: Internal Medicine

## 2013-07-11 ENCOUNTER — Other Ambulatory Visit: Payer: Self-pay | Admitting: *Deleted

## 2013-07-11 DIAGNOSIS — F0280 Dementia in other diseases classified elsewhere without behavioral disturbance: Secondary | ICD-10-CM

## 2013-07-11 DIAGNOSIS — G309 Alzheimer's disease, unspecified: Secondary | ICD-10-CM

## 2013-07-11 NOTE — Telephone Encounter (Signed)
Medication refused - patient sees Dr. Julien Nordmann

## 2013-08-09 ENCOUNTER — Encounter: Payer: Self-pay | Admitting: Internal Medicine

## 2013-08-09 ENCOUNTER — Ambulatory Visit: Payer: Medicare Other | Admitting: Internal Medicine

## 2013-08-09 VITALS — BP 166/86 | HR 60 | Resp 12

## 2013-08-09 DIAGNOSIS — I10 Essential (primary) hypertension: Secondary | ICD-10-CM

## 2013-08-09 DIAGNOSIS — E1129 Type 2 diabetes mellitus with other diabetic kidney complication: Secondary | ICD-10-CM

## 2013-08-09 DIAGNOSIS — E785 Hyperlipidemia, unspecified: Secondary | ICD-10-CM

## 2013-08-09 DIAGNOSIS — F0280 Dementia in other diseases classified elsewhere without behavioral disturbance: Secondary | ICD-10-CM

## 2013-08-09 NOTE — Progress Notes (Signed)
Subjective:    Patient ID: Sara Gilbert, female    DOB: Oct 23, 1945, 67 y.o.   MRN: [id number]  HPI Husband here  Inetta Fermo-- hospice nurse also here Reviewed her meds  Doing okay Was in hospice home for respite for 5 days--- husband went to [place] for event at [employer] She did okay there Amlodipine/benzapril changed to just amlodipine 2.5---not sure why  Still walks briefly with walker in home Had regressed slightly after 5 days in respite--but he is back doing this again He holds her up while she walks Transfers with husband's assist Regular toileting--but still incontinent mostly No falls Still has hospice aide every morning to help with AM care. Has some family help as well (just sitters) Husband still will take her out for a meal at times. They still get the 1 meal a day from [place] Needs to be fed at least partially-- but will eat some finger foods  No chest pain No SOB No dizziness or syncope  Generally satisfied No problems with depression   Current Outpatient Prescriptions on File Prior to Visit  Medication Sig Dispense Refill  . amLODipine-benazepril (LOTREL) 10-40 MG per capsule Take 1 capsule by mouth daily.  90 capsule  3  . aspirin 81 MG chewable tablet Chew 81 mg by mouth daily.      . cholecalciferol (VITAMIN D) 1000 UNITS tablet Take 1,000 Units by mouth daily.      . Ferrous Sulfate 140 (45 FE) MG TBCR Take 1 tablet by mouth daily.      Marland Kitchen FLUoxetine (PROZAC) 40 MG capsule Take 40 mg by mouth daily.      . Infant Care Products (DERMACLOUD EX) Apply 1 application topically 3 (three) times daily as needed.      . insulin glargine (LANTUS) 100 UNIT/ML injection Inject 26 Units into the skin at bedtime. Dx 250.40      . metFORMIN (GLUCOPHAGE) 1000 MG tablet TAKE 1 TABLET BY MOUTH TWICE A DAY  180 tablet  1  . metoprolol tartrate (LOPRESSOR) 25 MG tablet Take 1 tablet (25 mg total) by mouth 2 (two) times daily.  60 tablet  6  . Multiple Vitamin  (MULTIVITAMIN) tablet Take 1 tablet by mouth daily.      Marland Kitchen NAMENDA XR 28 MG CP24 TAKE ONE CAPSULE BY MOUTH ONCE A DAY  30 capsule  11  . ONE TOUCH ULTRA TEST test strip USE AS DIRECTED TEST BLOOD SUGAR DAILY  100 each  0  . pravastatin (PRAVACHOL) 40 MG tablet Take 1 tablet (40 mg total) by mouth daily.  90 tablet  3  . prochlorperazine (COMPAZINE) 10 MG tablet Take 10 mg by mouth every 6 (six) hours as needed.       No current facility-administered medications on file prior to visit.    Allergies  Allergen Reactions  . Tetanus Toxoids Swelling and Rash    Past Medical History  Diagnosis Date  . Type II or unspecified type diabetes mellitus with neurological manifestations, not stated as uncontrolled(250.60)   . Hypertension   . Type II or unspecified type diabetes mellitus with renal manifestations, not stated as uncontrolled(250.40)   . Hyperlipidemia   . Alzheimer's dementia 2010?  Marland Kitchen Goiter   . Depression   . Osteoarthritis, hand   . Vitamin D deficiency     Normal DEXA 11/09    Past Surgical History  Procedure Laterality Date  . Tonsillectomy    . Cardiac catheterization  [facility]    Family History  Problem Relation Age of Onset  . Dementia Mother   . Breast cancer Mother   . Hypertension Mother   . Coronary artery disease Father   . Hypertension Father   . Diabetes Father     History   Social History  . Marital Status: Married    Spouse Name: N/A    Number of Children: 1  . Years of Education: N/A   Occupational History  . 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 Smoking status: Former Smoker    Types: Cigarettes    Quit date: 09/21/1997  . Smokeless tobacco: Never Used  . Alcohol Use: No  . Drug Use: No  . Sexual Activity: Not on file   Other Topics Concern  . Not on file   Social History Narrative   Lives with her husband   Has living will--from many years ago   No specific health care POA---but requests husband  then daughter   DNR done 08/09/13 ---husband feels this is her wish from prior discussions   Review of Systems Appetite is fine Sleeps okay No pain  Drags her left foot    Objective:   Physical Exam  Constitutional: She appears well-developed. No distress.  Neck: Normal range of motion. Neck supple.  Cardiovascular: Normal rate, regular rhythm and normal heart sounds.  Exam reveals no gallop.   No murmur heard. Pulmonary/Chest: Effort normal and breath sounds normal. No respiratory distress. She has no wheezes. She has no rales.  Abdominal: Soft. There is no tenderness.  Musculoskeletal:  Decreased muscle mass in legs and arms  Lymphadenopathy:    She has no cervical adenopathy.  Neurological: She is alert.  Psychiatric:  Seems satisified          Assessment & Plan:

## 2013-08-09 NOTE — Assessment & Plan Note (Signed)
Sugars have been good No hypoglycemia Will continue current dosing  No longer will be seeing Dr Wynelle Link Last creatinine was better

## 2013-08-09 NOTE — Assessment & Plan Note (Signed)
BP Readings from Last 3 Encounters:  08/09/13 166/86  05/24/13 122/70  02/22/13 142/72   Will change her back to her usual amlodipine/benazepril 10/40 RN will monitor

## 2013-08-09 NOTE — Assessment & Plan Note (Signed)
On statin No problems with this

## 2013-08-09 NOTE — Assessment & Plan Note (Signed)
Moderate cognitive and severe functional deficits Continues on hospice care On [facility]---unclear how much it is helping but will continue

## 2013-08-09 NOTE — Assessment & Plan Note (Signed)
Continue the benazepril

## 2013-08-22 ENCOUNTER — Telehealth: Payer: Self-pay

## 2013-08-22 MED ORDER — "PEN NEEDLES 5/16"" 31G X 8 MM MISC": Status: AC

## 2013-08-22 NOTE — Telephone Encounter (Signed)
Mr Pequignot said pt used last pen needle 31Gx8MM, 5/16" today; request refill to [facility] Advised done.

## 2013-09-12 DIAGNOSIS — F0280 Dementia in other diseases classified elsewhere without behavioral disturbance: Secondary | ICD-10-CM

## 2013-09-12 DIAGNOSIS — G309 Alzheimer's disease, unspecified: Secondary | ICD-10-CM

## 2013-09-27 ENCOUNTER — Other Ambulatory Visit: Payer: Self-pay | Admitting: Cardiovascular Disease

## 2013-09-30 ENCOUNTER — Other Ambulatory Visit: Payer: Self-pay | Admitting: Internal Medicine

## 2013-10-02 NOTE — Telephone Encounter (Signed)
Ok to fill 

## 2013-10-02 NOTE — Telephone Encounter (Signed)
rx sent to pharmacy by e-script  

## 2013-10-02 NOTE — Telephone Encounter (Signed)
Okay to fill for a year 

## 2013-10-10 ENCOUNTER — Telehealth: Payer: Self-pay | Admitting: Internal Medicine

## 2013-10-10 NOTE — Telephone Encounter (Signed)
Charles pt's husband is calling and had questions about getting FMLA for his daughter to help out with his wife. Please contact her husband at [phone].

## 2013-10-11 NOTE — Telephone Encounter (Signed)
Pt's husband trying to get FMLA for his daughter so she may be at the home visits and maybe can give the husband some relief. I advised that his daughter would need to get he paperwork from her job if it can be filled out. Please advise

## 2013-10-11 NOTE — Telephone Encounter (Signed)
Spoke with patient and advised results   

## 2013-10-11 NOTE — Telephone Encounter (Signed)
That is correct There is a $20 charge If she gets the papers, have her fill out her part and then drop them off She should specify how many days per week or month she thinks she may need to miss work

## 2013-10-15 ENCOUNTER — Other Ambulatory Visit: Payer: Self-pay | Admitting: Internal Medicine

## 2013-10-16 DIAGNOSIS — Z0279 Encounter for issue of other medical certificate: Secondary | ICD-10-CM

## 2013-10-16 NOTE — Telephone Encounter (Signed)
Okay to fill for a year 

## 2013-10-16 NOTE — Telephone Encounter (Signed)
rx sent to pharmacy by e-script  

## 2013-10-16 NOTE — Telephone Encounter (Signed)
Ok to fill 

## 2013-10-18 ENCOUNTER — Telehealth: Payer: Self-pay | Admitting: Internal Medicine

## 2013-10-18 NOTE — Telephone Encounter (Signed)
Pt's daughter is applying for FMLA for care of pt and forms have been dropped off to complete.  I have completed as much of them as I could, but there are some areas I was uncertain of (they are flagged w/yellow flags) and Dr. Alphonsus SiasLetvak needs to review.  Once he has reviewed the forms and completed the necessary sections, please return them to me.  I have placed the forms in an orange folder and put the folder on your desk.  Thank you.

## 2013-10-19 NOTE — Telephone Encounter (Signed)
I filled in the spots  It is vital that we know the plan of the person asking for FMLA and she didn't specify this. Does she need a block of time off--- like the next 3 weeks (I don't think so), or intermittent days to support her dad who does the 24/7 care for her mom. Please clarify this with her and put it down

## 2013-10-20 NOTE — Telephone Encounter (Signed)
I thought pt's family had already discussed the plan w/you. I called Mr. Earl ManyBarnwell, pt's husband and he says daughter will need intermittent FMLA for approx 2 days per week; 8 hours per day. Notified Mr. Earl ManyBarnwell the forms are ready for p/u at the front.

## 2013-10-30 ENCOUNTER — Telehealth: Payer: Self-pay

## 2013-10-30 NOTE — Telephone Encounter (Signed)
Leonette MostCharles pt's husband received denial from ins co for home visits on 11/11/12 and 09/12/13. I do not see where home visits were done on those dates; Leonette MostCharles said pt is also receiving services from Hospice. Leonette MostCharles will bring copy of paperwork received from ins co by office for Adrienne on 10/31/13.

## 2013-10-31 DIAGNOSIS — F0280 Dementia in other diseases classified elsewhere without behavioral disturbance: Secondary | ICD-10-CM

## 2013-10-31 DIAGNOSIS — G309 Alzheimer's disease, unspecified: Secondary | ICD-10-CM

## 2013-10-31 NOTE — Telephone Encounter (Signed)
Working on patient's account with our billing office.  Pt's husband, Leonette MostCharles, knows I am looking into it and will call him once the issues are resolved.

## 2013-11-08 ENCOUNTER — Ambulatory Visit: Payer: Medicare Other | Admitting: Internal Medicine

## 2013-11-08 ENCOUNTER — Encounter: Payer: Self-pay | Admitting: Internal Medicine

## 2013-11-08 VITALS — BP 138/68 | HR 66 | Resp 12

## 2013-11-08 DIAGNOSIS — E1129 Type 2 diabetes mellitus with other diabetic kidney complication: Secondary | ICD-10-CM

## 2013-11-08 DIAGNOSIS — N183 Chronic kidney disease, stage 3 unspecified: Secondary | ICD-10-CM

## 2013-11-08 DIAGNOSIS — G309 Alzheimer's disease, unspecified: Principal | ICD-10-CM

## 2013-11-08 DIAGNOSIS — E11319 Type 2 diabetes mellitus with unspecified diabetic retinopathy without macular edema: Secondary | ICD-10-CM

## 2013-11-08 DIAGNOSIS — E1139 Type 2 diabetes mellitus with other diabetic ophthalmic complication: Secondary | ICD-10-CM

## 2013-11-08 DIAGNOSIS — F3289 Other specified depressive episodes: Secondary | ICD-10-CM

## 2013-11-08 DIAGNOSIS — I10 Essential (primary) hypertension: Secondary | ICD-10-CM

## 2013-11-08 DIAGNOSIS — E785 Hyperlipidemia, unspecified: Secondary | ICD-10-CM

## 2013-11-08 DIAGNOSIS — F329 Major depressive disorder, single episode, unspecified: Secondary | ICD-10-CM

## 2013-11-08 DIAGNOSIS — F0280 Dementia in other diseases classified elsewhere without behavioral disturbance: Secondary | ICD-10-CM

## 2013-11-08 DIAGNOSIS — F32A Depression, unspecified: Secondary | ICD-10-CM

## 2013-11-08 NOTE — Assessment & Plan Note (Signed)
It is not feasible to get her to the eye doctor anymore

## 2013-11-08 NOTE — Assessment & Plan Note (Signed)
Last A1c 7.5% No hypoglycemia If continued weight loss, will probably need to wean insulin If AM sugars under 120, would decrease by a few units

## 2013-11-08 NOTE — Assessment & Plan Note (Signed)
Not as clear cut Will have them decrease the fluoxetine to 40mg  every other day If fine after a month, can try to decrease it to every 3rd day

## 2013-11-08 NOTE — Assessment & Plan Note (Signed)
Given her overall condition, we will stop the statin

## 2013-11-08 NOTE — Assessment & Plan Note (Signed)
Has had both cognitive and physical decline Weight loss---discussed increasing ensure supplements Continues on hospice care May need to consider getting a lift in the next few months if physical ability continues to decline

## 2013-11-08 NOTE — Assessment & Plan Note (Signed)
Last GFR was better No changes

## 2013-11-08 NOTE — Progress Notes (Signed)
Subjective:    Patient ID: Sara Gilbert, female    DOB: 12-07-1945, 68 y.o.   MRN: [id number]  HPI Husband and daughter are here [name]--hospice nurse also  Can only transfer from chair to wheelchair and bed No longer walking---even with husband's help No longer able to turn her body Still has daily hospice aide for personal care Husband has to feed her--she is able to feed herself finger foods (if a sweet and very limited---will start and then just stop). Will start eating, then stop quickly. Seems to fill up quickly. Biggest meal is lunch. 1 ensure daily Very little language---will point to get ideas across (and can speak-but not often) He tries to bring her to the bathroom ---but rarely is this successful  Husband checks daily fasting sugars 150-200, rare over 200 No hypoglycemic reactions No sores in feet  No chest pain No SOB No edema  Generally satisfied Not depressed that we can tell  Current Outpatient Prescriptions on File Prior to Visit  Medication Sig Dispense Refill  . amLODipine-benazepril (LOTREL) 10-40 MG per capsule TAKE 1 CAPSULE BY MOUTH DAILY.  90 capsule  3  . aspirin 81 MG chewable tablet Chew 81 mg by mouth daily.      . cholecalciferol (VITAMIN D) 1000 UNITS tablet Take 1,000 Units by mouth daily.      . Ferrous Sulfate 140 (45 FE) MG TBCR Take 1 tablet by mouth daily.      Marland Kitchen. FLUoxetine (PROZAC) 40 MG capsule Take 40 mg by mouth daily.      . Infant Care Products (DERMACLOUD EX) Apply 1 application topically 3 (three) times daily as needed.      . insulin glargine (LANTUS) 100 UNIT/ML injection Inject 26 Units into the skin at bedtime. Dx 250.40      . Insulin Pen Needle (PEN NEEDLES 31GX5/16") 31G X 8 MM MISC To use with lantus solostar pen. Dx 250.40  100 each  3  . metFORMIN (GLUCOPHAGE) 1000 MG tablet TAKE 1 TABLET BY MOUTH TWICE A DAY  180 tablet  1  . metoprolol tartrate (LOPRESSOR) 25 MG tablet Take 1 tablet (25 mg total) by mouth 2 (two)  times daily.  60 tablet  6  . Multiple Vitamin (MULTIVITAMIN) tablet Take 1 tablet by mouth daily.      Marland Kitchen. NAMENDA XR 28 MG CP24 TAKE ONE CAPSULE BY MOUTH ONCE A DAY  90 capsule  3  . ONE TOUCH ULTRA TEST test strip USE AS DIRECTED TEST BLOOD SUGAR DAILY  100 each  0  . pravastatin (PRAVACHOL) 40 MG tablet Take 1 tablet (40 mg total) by mouth daily.  90 tablet  3  . prochlorperazine (COMPAZINE) 10 MG tablet Take 10 mg by mouth every 6 (six) hours as needed.       No current facility-administered medications on file prior to visit.    Allergies  Allergen Reactions  . Tetanus Toxoids Swelling and Rash    Past Medical History  Diagnosis Date  . Type II or unspecified type diabetes mellitus with neurological manifestations, not stated as uncontrolled(250.60)   . Hypertension   . Type II or unspecified type diabetes mellitus with renal manifestations, not stated as uncontrolled(250.40)   . Hyperlipidemia   . Alzheimer's dementia 2010?  Marland Kitchen. Goiter   . Depression   . Osteoarthritis, hand   . Vitamin D deficiency     Normal DEXA 11/09    Past Surgical History  Procedure Laterality Date  .  Tonsillectomy    . Cardiac catheterization      [facility]    Family History  Problem Relation Age of Onset  . Dementia Mother   . Breast cancer Mother   . Hypertension Mother   . Coronary artery disease Father   . Hypertension Father   . Diabetes Father     History   Social History  . Marital Status: Married    Spouse Name: N/A    Number of Children: 1  . Years of Education: N/A   Occupational History  . 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 Smoking status: Former Smoker    Types: Cigarettes    Quit date: 09/21/1997  . Smokeless tobacco: Never Used  . Alcohol Use: No  . Drug Use: No  . Sexual Activity: Not on file   Other Topics Concern  . Not on file   Social History Narrative   Lives with her husband   Has living will--from many years ago   No  specific health care POA---but requests husband then daughter   DNR done 08/09/13 ---husband feels this is her wish from prior discussions   Review of Systems Still seems to be losing weight-- 10# in just the past month or so Has intermittent red area on sacrum---occasionally will open Sleeps well--as much as 16-18 hours per day    Objective:   Physical Exam  Constitutional: She appears well-developed. No distress.  Neck: Normal range of motion. Neck supple. No thyromegaly present.  Cardiovascular: Normal rate, regular rhythm, normal heart sounds and intact distal pulses.  Exam reveals no gallop.   No murmur heard. Pulmonary/Chest: Effort normal and breath sounds normal. No respiratory distress. She has no wheezes. She has no rales.  Abdominal: Soft. There is no tenderness.  Musculoskeletal: She exhibits no edema and no tenderness.  Lymphadenopathy:    She has no cervical adenopathy.  Neurological:  Terse single word answers Did say one full sentence to husband No focal weakness  Skin:  No foot lesions  Psychiatric:  Mood is neutral Appropriate affect          Assessment & Plan:

## 2013-11-08 NOTE — Assessment & Plan Note (Signed)
BP Readings from Last 3 Encounters:  11/08/13 138/68  08/09/13 166/86  05/24/13 122/70   Good control No changes needed

## 2013-11-09 ENCOUNTER — Telehealth: Payer: Self-pay | Admitting: Internal Medicine

## 2013-11-09 NOTE — Telephone Encounter (Signed)
Relevant patient education assigned to patient using Emmi. ° °

## 2013-11-10 ENCOUNTER — Telehealth: Payer: Self-pay

## 2013-11-10 NOTE — Telephone Encounter (Signed)
Relevant patient education assigned to patient using Emmi. ° °

## 2013-12-18 ENCOUNTER — Telehealth: Payer: Self-pay

## 2013-12-18 NOTE — Telephone Encounter (Signed)
[facility] left v[facility]m; Sara Gilbert saw pt this AM and pt had bubbling sounds in rt lower lobe of the lung; Sara Gilbert wonders if pt has aspirated or has fluid build up. Sara Gilbert wants to know if could get order for pulse ox;pt was lethargic and withdrawn this AM. Sara Gilbert wants to know if pt could be given Lasix to see if might dry up the ? Fluid. No fever or cough. [name] request cb.

## 2013-12-18 NOTE — Telephone Encounter (Signed)
Please call her Okay to try furosemide 40mg  if indicated due to concerns about fluid Please have her check pulse ox

## 2013-12-19 MED ORDER — FUROSEMIDE 40 MG PO TABS: 40.0000 mg | ORAL_TABLET | Freq: Every day | ORAL | Status: DC | PRN

## 2013-12-19 NOTE — Telephone Encounter (Signed)
Spoke with hospice nurse and advised results rx sent to pharmacy by e-script Pulse ox was normal

## 2013-12-25 DIAGNOSIS — F0280 Dementia in other diseases classified elsewhere without behavioral disturbance: Secondary | ICD-10-CM

## 2013-12-25 DIAGNOSIS — G309 Alzheimer's disease, unspecified: Secondary | ICD-10-CM

## 2013-12-28 ENCOUNTER — Telehealth: Payer: Self-pay

## 2013-12-28 NOTE — Telephone Encounter (Signed)
Okay to just stop at this point. If she has problems, we can just start it up again  Discussed with Inetta Fermoina

## 2013-12-28 NOTE — Telephone Encounter (Signed)
[name] nurse with [facility] left v/m; pt has been taking Fluoxetine every 3rd day for 2 weeks; what is next step to get pt off Fluoxetine. Tine request cb.

## 2014-01-18 ENCOUNTER — Other Ambulatory Visit: Payer: Self-pay | Admitting: *Deleted

## 2014-01-18 MED ORDER — GLUCOSE BLOOD VI STRP: ORAL_STRIP | Status: AC

## 2014-01-19 ENCOUNTER — Other Ambulatory Visit: Payer: Self-pay | Admitting: Internal Medicine

## 2014-01-24 ENCOUNTER — Ambulatory Visit: Payer: Medicare Other | Admitting: Internal Medicine

## 2014-01-24 ENCOUNTER — Encounter: Payer: Self-pay | Admitting: Internal Medicine

## 2014-01-24 VITALS — BP 124/68 | HR 60 | Resp 12

## 2014-01-24 DIAGNOSIS — N183 Chronic kidney disease, stage 3 unspecified: Secondary | ICD-10-CM | POA: Diagnosis not present

## 2014-01-24 DIAGNOSIS — F3289 Other specified depressive episodes: Secondary | ICD-10-CM

## 2014-01-24 DIAGNOSIS — G309 Alzheimer's disease, unspecified: Principal | ICD-10-CM

## 2014-01-24 DIAGNOSIS — I10 Essential (primary) hypertension: Secondary | ICD-10-CM

## 2014-01-24 DIAGNOSIS — F0280 Dementia in other diseases classified elsewhere without behavioral disturbance: Secondary | ICD-10-CM

## 2014-01-24 DIAGNOSIS — E1129 Type 2 diabetes mellitus with other diabetic kidney complication: Secondary | ICD-10-CM

## 2014-01-24 DIAGNOSIS — F329 Major depressive disorder, single episode, unspecified: Secondary | ICD-10-CM

## 2014-01-24 DIAGNOSIS — E785 Hyperlipidemia, unspecified: Secondary | ICD-10-CM

## 2014-01-24 DIAGNOSIS — F32A Depression, unspecified: Secondary | ICD-10-CM

## 2014-01-24 NOTE — Progress Notes (Signed)
Subjective:    Patient ID: Sara Gilbert, female    DOB: May 28, 1946, 68 y.o.   MRN: [id number]  HPI Husband here as usual  Sleeping a lot now Usually goes to bed at 8PM and may sleep beyond noon (13 hours or more at a stretch usually) Then will still nap after that May sleep 16 hours a day Alert and engaged perhaps half the time she is awake Variable speech--but generally speaking much less  No walking Will still stand to transfer He brings her to the toilet regularly---partial success with bowels Mostly incontinent for urine  Appetite is down At best 50%--has been losing muscle mass  Mood has been okay Husband decreased the fluoxetine to every 3 rd day Will be stopping soon (when she runs out)  Hasn't needed the lasix No SOB No chest pain  Husband checks sugars daily--fasting Usually in 100-120 range No apparent low sugar reactions He has cut the lantus down to 20 units daily  Has daily hospice just for bath-- M-F Daughter and SIL help on weekends  Current Outpatient Prescriptions on File Prior to Visit  Medication Sig Dispense Refill  . amLODipine-benazepril (LOTREL) 10-40 MG per capsule TAKE 1 CAPSULE BY MOUTH DAILY.  90 capsule  3  . aspirin 81 MG chewable tablet Chew 81 mg by mouth daily.      Marland Kitchen. FLUoxetine (PROZAC) 40 MG capsule Take 40 mg by mouth every 3 (three) days.       . furosemide (LASIX) 40 MG tablet Take 1 tablet (40 mg total) by mouth daily as needed for fluid.  30 tablet  0  . glucose blood (ONE TOUCH TEST STRIPS) test strip Use as instructed to test blood sugar once daily dx: 250.40  100 each  1  . Infant Care Products (DERMACLOUD EX) Apply 1 application topically 3 (three) times daily as needed.      . Insulin Pen Needle (PEN NEEDLES 31GX5/16") 31G X 8 MM MISC To use with lantus solostar pen. Dx 250.40  100 each  3  . metFORMIN (GLUCOPHAGE) 1000 MG tablet TAKE 1 TABLET BY MOUTH TWICE A DAY  180 tablet  1  . metoprolol tartrate (LOPRESSOR) 25  MG tablet Take 1 tablet (25 mg total) by mouth 2 (two) times daily.  60 tablet  6  . NAMENDA XR 28 MG CP24 TAKE ONE CAPSULE BY MOUTH ONCE A DAY  90 capsule  3  . prochlorperazine (COMPAZINE) 10 MG tablet Take 10 mg by mouth every 6 (six) hours as needed.       No current facility-administered medications on file prior to visit.    Allergies  Allergen Reactions  . Tetanus Toxoids Swelling and Rash    Past Medical History  Diagnosis Date  . Type II or unspecified type diabetes mellitus with neurological manifestations, not stated as uncontrolled   . Hypertension   . Type II or unspecified type diabetes mellitus with renal manifestations, not stated as uncontrolled   . Hyperlipidemia   . Alzheimer's dementia 2010?  Marland Kitchen. Goiter   . Depression   . Osteoarthritis, hand   . Vitamin D deficiency     Normal DEXA 11/09    Past Surgical History  Procedure Laterality Date  . Tonsillectomy    . Cardiac catheterization      [facility]    Family History  Problem Relation Age of Onset  . Dementia Mother   . Breast cancer Mother   . Hypertension Mother   .  Coronary artery disease Father   . Hypertension Father   . Diabetes Father     History   Social History  . Marital Status: Married    Spouse Name: N/A    Number of Children: 1  . Years of Education: N/A   Occupational History  . 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 Smoking status: Former Smoker    Types: Cigarettes    Quit date: 09/21/1997  . Smokeless tobacco: Never Used  . Alcohol Use: No  . Drug Use: No  . Sexual Activity: Not on file   Other Topics Concern  . Not on file   Social History Narrative   Lives with her husband   Has living will--from many years ago   No specific health care POA---but requests husband then daughter   DNR done 08/09/13 ---husband feels this is her wish from prior discussions   Review of Systems No abdominal pain No vomiting No heartburn No fever or sig  cough Has had some rare choking with eating--last time was last week. Did cough for a while and then cleared    Objective:   Physical Exam  Constitutional: She appears well-developed. No distress.  Neck: Normal range of motion. Neck supple. No thyromegaly present.  Cardiovascular: Normal rate, regular rhythm, normal heart sounds and intact distal pulses.  Exam reveals no gallop.   No murmur heard. Pulmonary/Chest: Effort normal and breath sounds normal. No respiratory distress. She has no wheezes. She has no rales.  Abdominal: Soft. There is no tenderness.  Musculoskeletal: She exhibits no edema and no tenderness.  Lymphadenopathy:    She has no cervical adenopathy.  Neurological:  Symmetric 3+-4/5 strength in extremities Tone normal or slightly decreased  Skin:  No foot lesions  Psychiatric:  A few words Very passive but does respond  Appears satisfied          Assessment & Plan:

## 2014-01-24 NOTE — Assessment & Plan Note (Signed)
Rx not appropriate.

## 2014-01-24 NOTE — Assessment & Plan Note (Signed)
Will recheck labs No obvious uremia

## 2014-01-24 NOTE — Assessment & Plan Note (Signed)
Well controlled without hypoglycemia Discussed that we will probably need to wean the lantus further--if sugars under 110  Will ask hospice to draw A1c and renal panel soon (250.40)

## 2014-01-24 NOTE — Assessment & Plan Note (Signed)
Not evident with cognitive decline Will try off the fluoxetine

## 2014-01-24 NOTE — Assessment & Plan Note (Signed)
BP Readings from Last 3 Encounters:  01/24/14 124/68  11/08/13 138/68  08/09/13 166/86   Good control If stays down, may need to decrease BP meds also

## 2014-01-24 NOTE — Assessment & Plan Note (Signed)
Severe with increased sleeping, decreased eating and weight loss, total care, incontinent generally On hospice care Husband doing okay Is getting 1 week of respite next week--she will be at hospice home

## 2014-01-28 ENCOUNTER — Other Ambulatory Visit: Payer: Self-pay | Admitting: Internal Medicine

## 2014-02-10 ENCOUNTER — Other Ambulatory Visit: Payer: Self-pay | Admitting: Internal Medicine

## 2014-02-15 LAB — HEMOGLOBIN A1C: HEMOGLOBIN A1C: 7.1 % — AB (ref 4.0–6.0)

## 2014-02-16 ENCOUNTER — Telehealth: Payer: Self-pay | Admitting: *Deleted

## 2014-02-16 NOTE — Telephone Encounter (Signed)
Called husband to advise that labs are fine and no changes are needed. Lab report scanned in.

## 2014-03-12 DIAGNOSIS — F0280 Dementia in other diseases classified elsewhere without behavioral disturbance: Secondary | ICD-10-CM

## 2014-03-12 DIAGNOSIS — G309 Alzheimer's disease, unspecified: Secondary | ICD-10-CM

## 2014-03-19 ENCOUNTER — Other Ambulatory Visit: Payer: Self-pay | Admitting: Cardiovascular Disease

## 2014-03-20 ENCOUNTER — Other Ambulatory Visit: Payer: Self-pay | Admitting: Internal Medicine

## 2014-04-04 ENCOUNTER — Encounter: Payer: Self-pay | Admitting: Internal Medicine

## 2014-04-04 ENCOUNTER — Ambulatory Visit: Payer: Medicare Other | Admitting: Internal Medicine

## 2014-04-04 VITALS — BP 144/70 | HR 70 | Resp 14 | Wt 142.0 lb

## 2014-04-04 DIAGNOSIS — F329 Major depressive disorder, single episode, unspecified: Secondary | ICD-10-CM

## 2014-04-04 DIAGNOSIS — F32A Depression, unspecified: Secondary | ICD-10-CM

## 2014-04-04 DIAGNOSIS — F3289 Other specified depressive episodes: Secondary | ICD-10-CM

## 2014-04-04 DIAGNOSIS — E1129 Type 2 diabetes mellitus with other diabetic kidney complication: Secondary | ICD-10-CM

## 2014-04-04 DIAGNOSIS — N183 Chronic kidney disease, stage 3 unspecified: Secondary | ICD-10-CM

## 2014-04-04 DIAGNOSIS — G309 Alzheimer's disease, unspecified: Principal | ICD-10-CM

## 2014-04-04 DIAGNOSIS — I10 Essential (primary) hypertension: Secondary | ICD-10-CM

## 2014-04-04 DIAGNOSIS — F0280 Dementia in other diseases classified elsewhere without behavioral disturbance: Secondary | ICD-10-CM

## 2014-04-04 NOTE — Assessment & Plan Note (Signed)
Last creatinine stable Will not be checking anymore

## 2014-04-04 NOTE — Assessment & Plan Note (Addendum)
Now in end stage Discussed issues with husband--may want to leave her in bed, consider catheter Will have respite next week--- she will go to hospice home  Okay to try her off the namenda

## 2014-04-04 NOTE — Assessment & Plan Note (Signed)
BP Readings from Last 3 Encounters:  04/04/14 144/70  01/24/14 124/68  11/08/13 138/68   Reasonable control

## 2014-04-04 NOTE — Progress Notes (Signed)
Subjective:    Patient ID: Sara Gilbert, female    DOB: [birth year], 68 y.o.   MRN: [id number]  HPI Husband and Inetta Fermoina, hospice RN are here  Has had further decline Having problems with food--- chews and then and pocketing food and not really swallowing Still swallows pills with water but only one at a time usually Usually will take 2 ensures per day---discussed increasing to 3 No coughing or choking  No longer standing to pivot for transfer Husband has to do total lift He has been leaving her in bed more of the time---due to sacral breakdown (and he rotates her in bed) Right now--just pink (stage 1) Got samples of spray to use  Incontinent all the time now Not much talking--does engage, but not verbally  Then will zone out for most of the time  Checking sugars every morning 150-200 mostly No apparent hypoglycemic spells  Still with hospice aide 5 days per week Weekly volunteer on Thursday's for 3 hours Some respite for husband by family (mostly daughter)  Current Outpatient Prescriptions on File Prior to Visit  Medication Sig Dispense Refill  . amLODipine-benazepril (LOTREL) 10-40 MG per capsule TAKE 1 CAPSULE BY MOUTH DAILY.  90 capsule  3  . aspirin 81 MG chewable tablet Chew 81 mg by mouth daily.      . furosemide (LASIX) 40 MG tablet TAKE 1 TABLET BY MOUTH EVERY DAY AS NEEDED FOR FLUID  30 tablet  0  . glucose blood (ONE TOUCH TEST STRIPS) test strip Use as instructed to test blood sugar once daily dx: 250.40  100 each  1  . Infant Care Products (DERMACLOUD EX) Apply 1 application topically 3 (three) times daily as needed.      . Insulin Glargine (LANTUS SOLOSTAR) 100 UNIT/ML Solostar Pen INJECT 20 UNITS SUB-Q EVERY EVENING AT BEDTIME      . Insulin Pen Needle (PEN NEEDLES 31GX5/16") 31G X 8 MM MISC To use with lantus solostar pen. Dx 250.40  100 each  3  . metFORMIN (GLUCOPHAGE) 1000 MG tablet TAKE 1 TABLET BY MOUTH TWICE A DAY  180 tablet  1  . metoprolol tartrate  (LOPRESSOR) 25 MG tablet TAKE 1 TABLET BY MOUTH TWICE A DAY  60 tablet  1  . NAMENDA XR 28 MG CP24 TAKE ONE CAPSULE BY MOUTH ONCE A DAY  90 capsule  3  . prochlorperazine (COMPAZINE) 10 MG tablet Take 10 mg by mouth every 6 (six) hours as needed.       No current facility-administered medications on file prior to visit.    Allergies  Allergen Reactions  . Tetanus Toxoids Swelling and Rash    Past Medical History  Diagnosis Date  . Type II or unspecified type diabetes mellitus with neurological manifestations, not stated as uncontrolled   . Hypertension   . Type II or unspecified type diabetes mellitus with renal manifestations, not stated as uncontrolled   . Hyperlipidemia   . Alzheimer's dementia 2010?  Marland Kitchen. Goiter   . Depression   . Osteoarthritis, hand   . Vitamin D deficiency     Normal DEXA 11/09    Past Surgical History  Procedure Laterality Date  . Tonsillectomy    . Cardiac catheter[facility]      [facility]    Family History  Problem Relation Age of Onset  . Dementia Mother   . Breast cancer Mother   . Hypertension Mother   . Coronary artery disease Father   . Hypertension Father   .  Diabetes Father     History   Social History  . Marital Status: Married    Spouse Name: N/A    Number of Children: 1  . Years of Education: N/A   Occupational History  . 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 Smoking status: Former Smoker    Types: Cigarettes    Quit date: 09/21/1997  . Smokeless tobacco: Never Used  . Alcohol Use: No  . Drug Use: No  . Sexual Activity: Not on file   Other Topics Concern  . Not on file   Social History Narrative   Lives with her husband   Has living will--from many years ago   No specific health care POA---but requests husband then daughter   DNR done 08/09/13 ---husband feels this is her wish from prior discussions   Review of Systems Bowels okay No cough or breathing problems Sleeps ~16 hours per day  now    Objective:   Physical Exam  Constitutional: No distress.  Has clear cut wasting  Neck: No thyromegaly present.  Cardiovascular: Normal rate, regular rhythm, normal heart sounds and intact distal pulses.  Exam reveals no gallop.   No murmur heard. Pulmonary/Chest: Effort normal and breath sounds normal. No respiratory distress. She has no wheezes. She has no rales.  Abdominal: Soft. There is no tenderness.  Musculoskeletal: She exhibits no edema.  Lymphadenopathy:    She has no cervical adenopathy.  Neurological:  Mild decreased tone Little active movement Barely engages at all No speech while I am here  Skin:  ?lipoma in left proximal arm          Assessment & Plan:

## 2014-04-04 NOTE — Assessment & Plan Note (Signed)
Lab Results  Component Value Date   HGBA1C 7.1* 02/15/2014   Discussed that her sugars are fine We just must avoid hypoglycemia--no worries about high sugars

## 2014-04-04 NOTE — Assessment & Plan Note (Signed)
Not evident now 

## 2014-04-26 DIAGNOSIS — F0280 Dementia in other diseases classified elsewhere without behavioral disturbance: Secondary | ICD-10-CM

## 2014-04-26 DIAGNOSIS — G309 Alzheimer's disease, unspecified: Secondary | ICD-10-CM

## 2014-05-08 IMAGING — CT CT HEAD WITHOUT CONTRAST
1 series · 16 of 30 positions shown, 20 images · non-contrast
Comparison: none

REASON FOR EXAM: weakness ams
COMMENTS:

PROCEDURE:     CT  - CT HEAD WITHOUT CONTRAST  - October 10, 2012  [DATE]
RESULT:     Comparison:  None
TECHNIQUE: Multiple axial images from the foramen magnum to the vertex were
obtained without IV contrast.

[Series 2: soft tissue · axial · 0.42mm/px · z∈[-154,-19]mm · 16 of 31 slices shown, 20 images]
[im 2/31  brain]
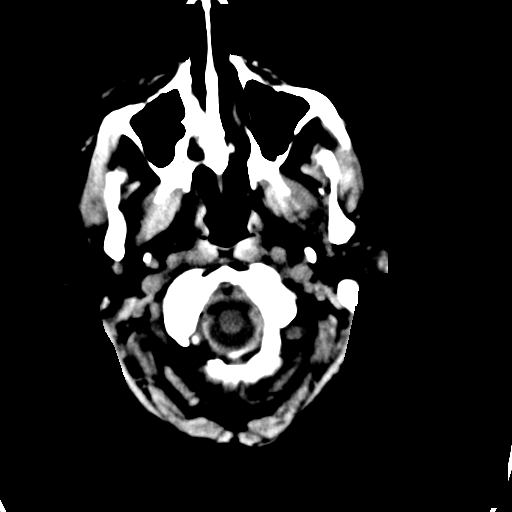
[im 2/31  bone]
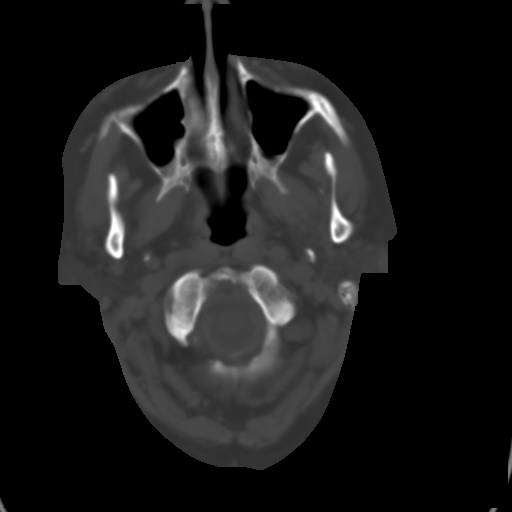
[im 4/31  brain]
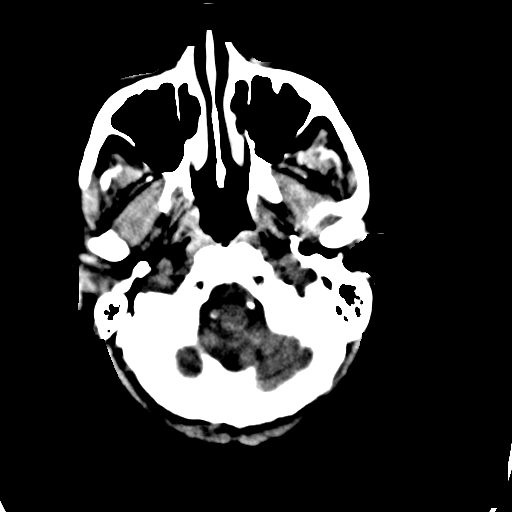
[im 6/31  brain]
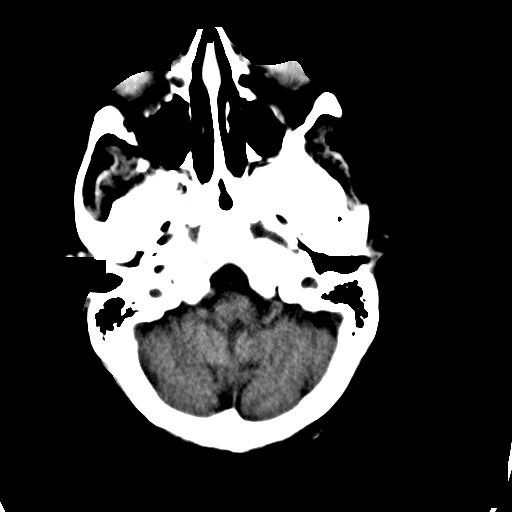
[im 8/31  brain]
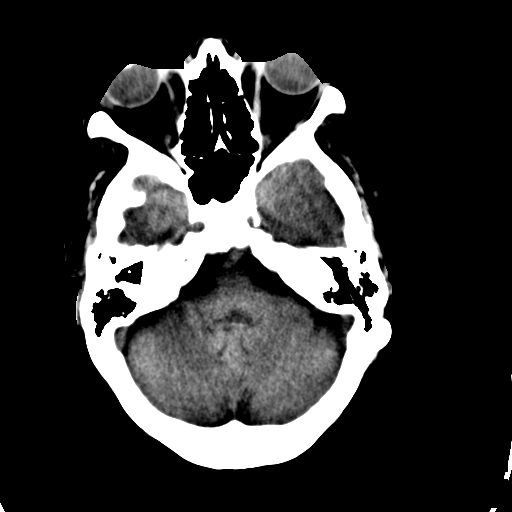
[im 9/31  brain]
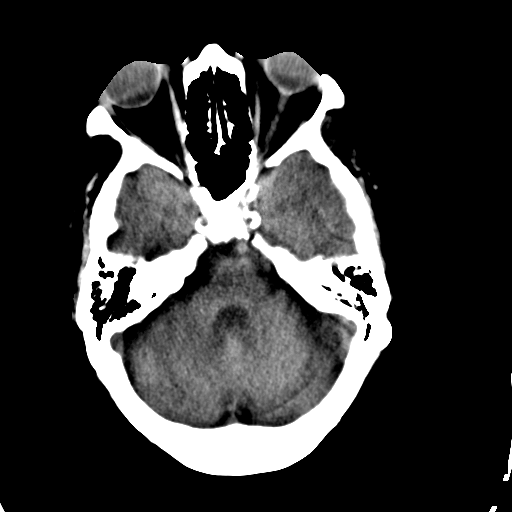
[im 9/31  bone]
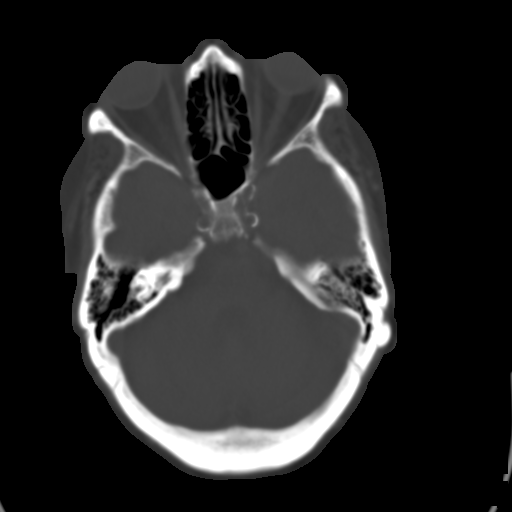
[im 11/31  brain]
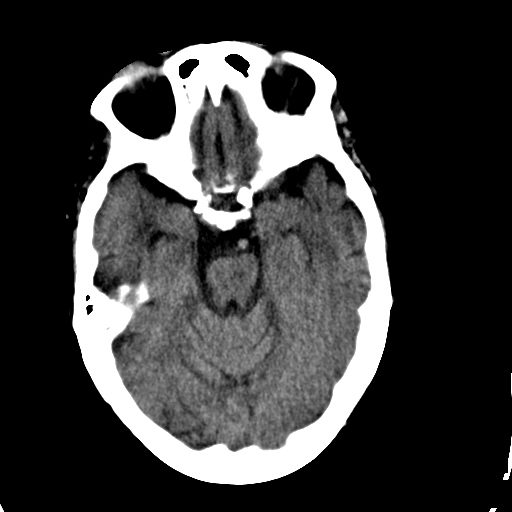
[im 13/31  brain]
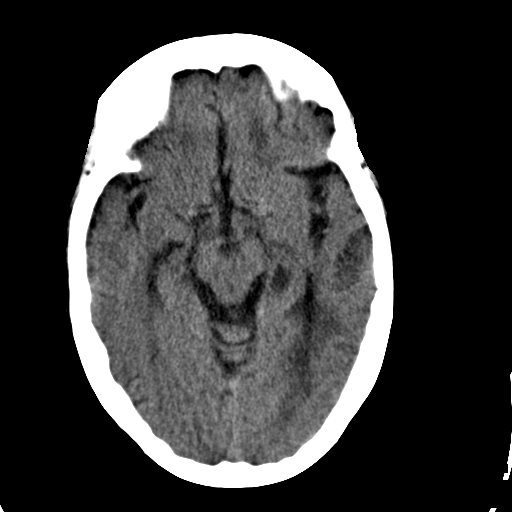
[im 15/31  brain]
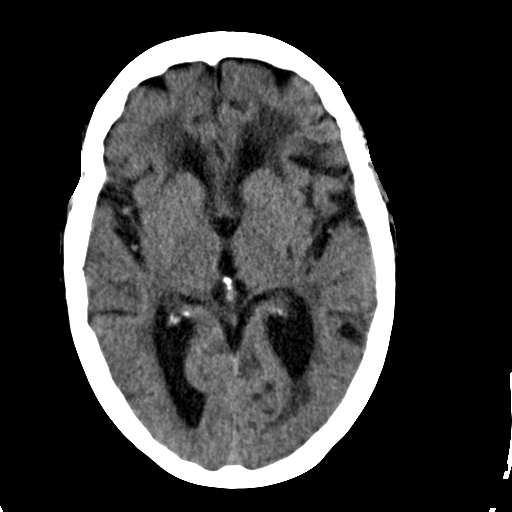
[im 16/31  brain]
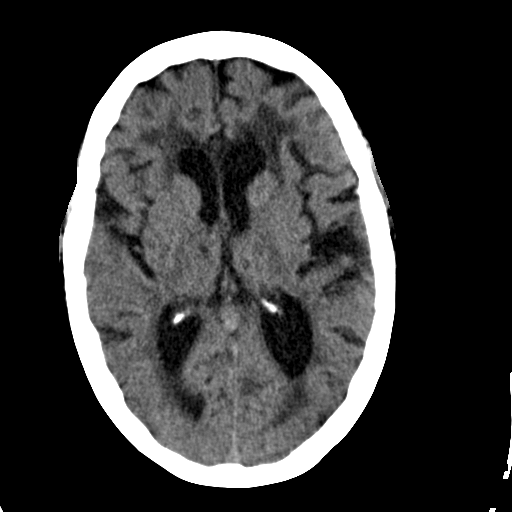
[im 16/31  bone]
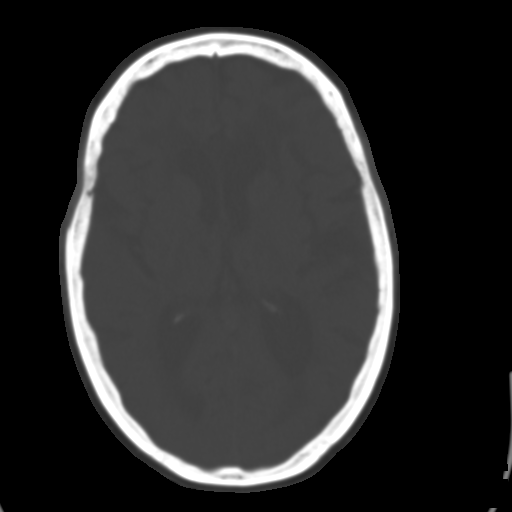
[im 18/31  brain]
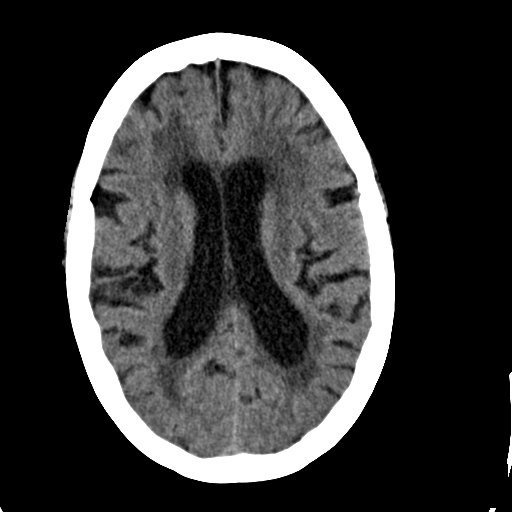
[im 20/31  brain]
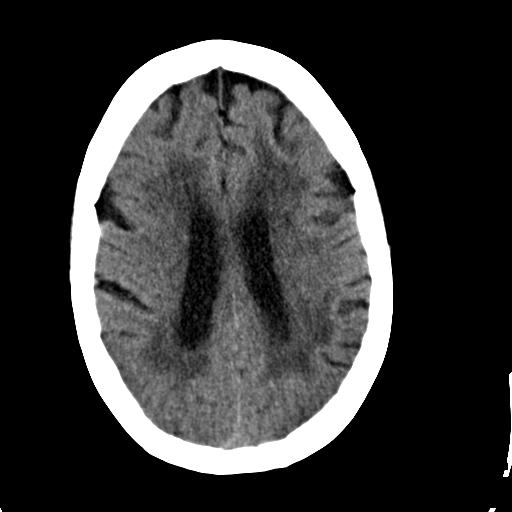
[im 22/31  brain]
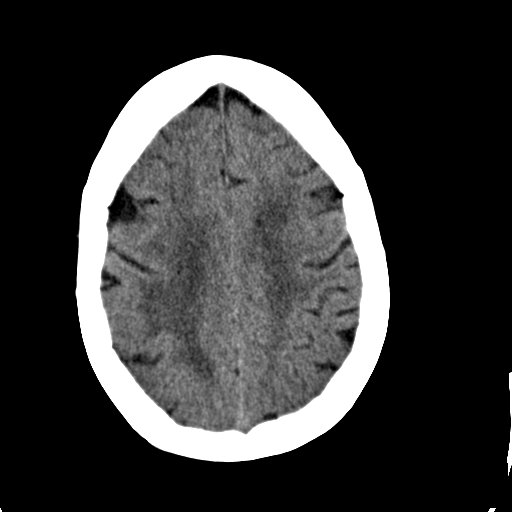
[im 23/31  brain]
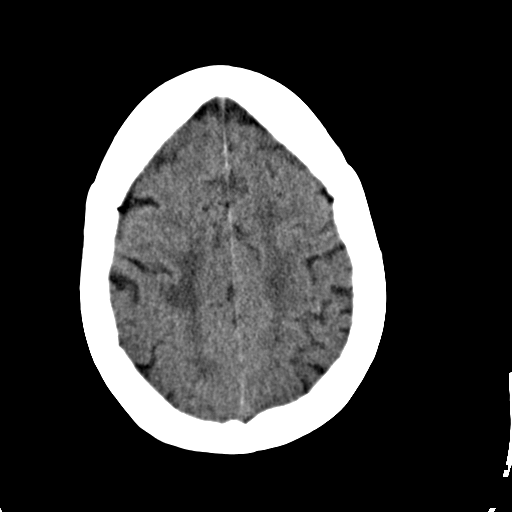
[im 23/31  bone]
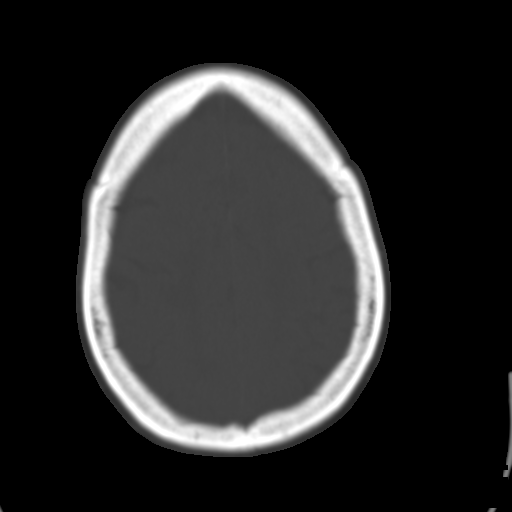
[im 25/31  brain]
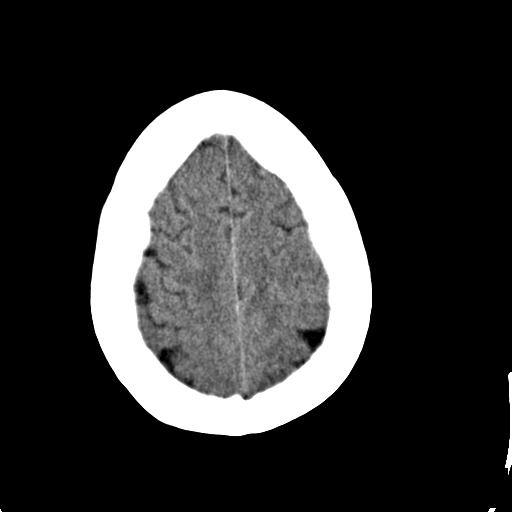
[im 27/31  brain]
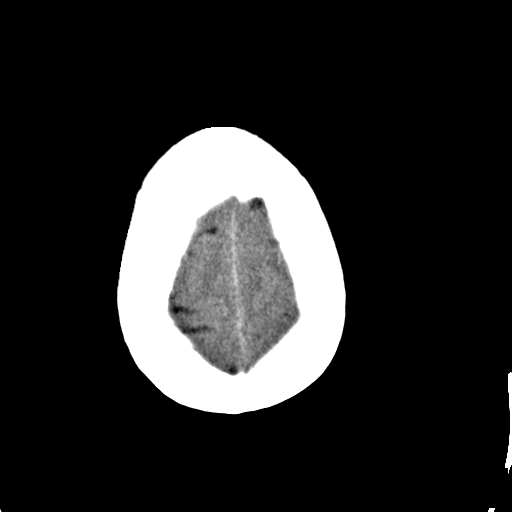
[im 29/31  brain]
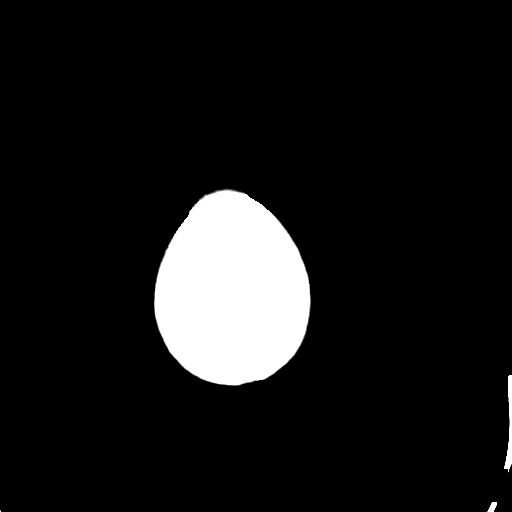

[16 of 30 positions shown; findings below may reference images not displayed]

FINDINGS: There is no evidence for mass effect, midline shift, or extra-axial fluid
collections. There is no evidence for space-occupying lesion, intracranial
hemorrhage, or cortical-based area of infarction. Periventricular and
subcortical hypoattenuation is consistent with chronic small vessel ischemic
disease. Small old lacunar infarct is demonstrated in the left caudate head.

The osseous structures are unremarkable.
IMPRESSION: 1. No acute intracranial process.
2. Chronic small vessel ischemic disease.

## 2014-05-08 IMAGING — CR DG CHEST 1V PORT
1 series · 1 of 1 positions shown · non-contrast
Comparison: none

REASON FOR EXAM: weakness
COMMENTS:

[ap]
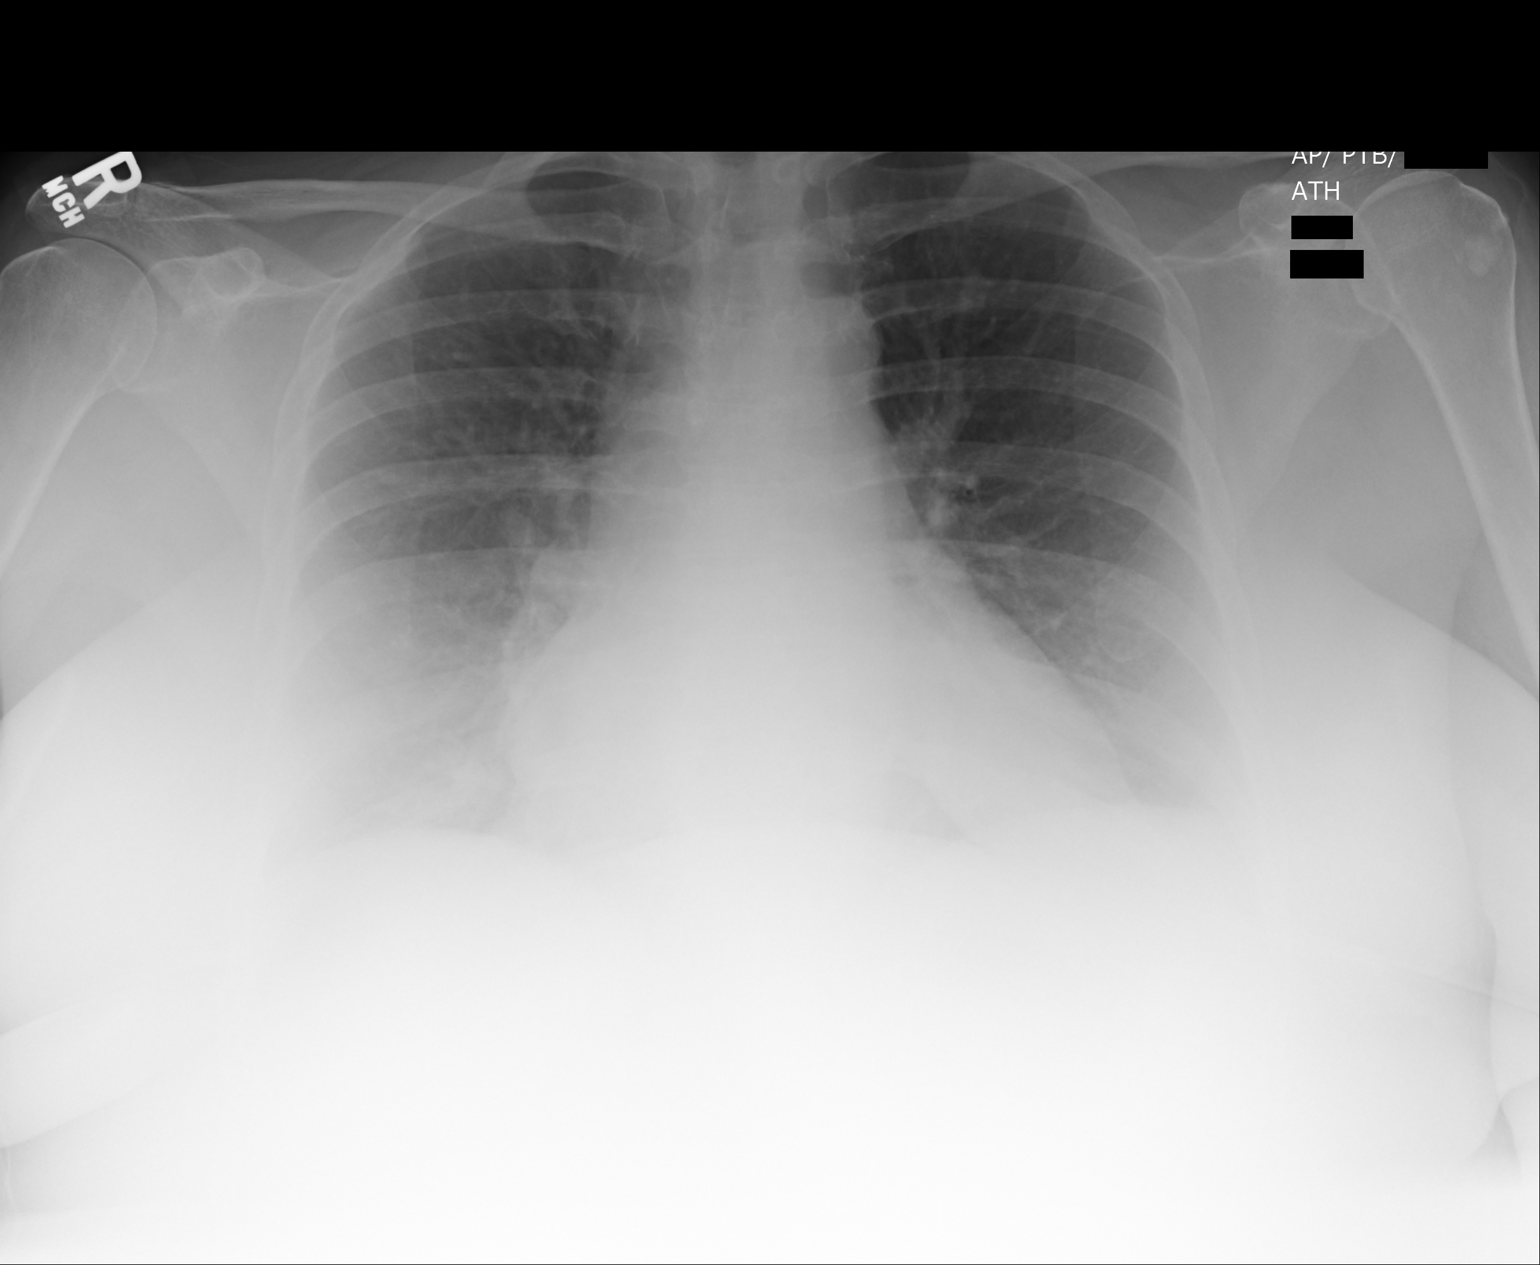

[1 of 1 positions shown; findings below may reference images not displayed]

PROCEDURE:     DXR - DXR PORTABLE CHEST SINGLE VIEW  - October 10, 2012  [DATE]

RESULT:     The study is slightly underexposed likely secondary to the
patient's body habitus. The lungs are clear. The heart and pulmonary vessels
are normal. The bony and mediastinal structures are unremarkable. There is
no effusion. There is no pneumothorax or evidence of congestive failure.
IMPRESSION: No acute cardiopulmonary disease.

[REDACTED]

## 2014-05-09 ENCOUNTER — Telehealth: Payer: Self-pay

## 2014-05-09 NOTE — Telephone Encounter (Signed)
Okay to do hemoglobin and A1c

## 2014-05-09 NOTE — Telephone Encounter (Signed)
Spoke with hospice nurse and advised results  

## 2014-05-09 NOTE — Telephone Encounter (Signed)
[name] with [facility]  left v/m requesting lab orders for hgb and BS . Pt husband requesting lab test done due to pt being pale and decreased appetite. Sara Gilbert has educated pts husband on decline but [name] does want labs done for hgb and blood sugar. [name] request cb.

## 2014-05-16 ENCOUNTER — Encounter: Payer: Self-pay | Admitting: Internal Medicine

## 2014-06-05 ENCOUNTER — Telehealth: Payer: Self-pay | Admitting: Internal Medicine

## 2014-06-05 NOTE — Telephone Encounter (Signed)
Phone call from [name]--hospice nurse Sugar over 400 today Has been declining and only eating ice cream---but never this high Still on lantus but not able to take metformin this morning  Orders given to get regular insulin and give 10 units if still over 400 Then repeat in 2 hours and repeat insulin till under 200 May need to increase lantus  She will update me

## 2014-06-07 DIAGNOSIS — E139 Other specified diabetes mellitus without complications: Secondary | ICD-10-CM

## 2014-06-07 DIAGNOSIS — F0280 Dementia in other diseases classified elsewhere without behavioral disturbance: Secondary | ICD-10-CM

## 2014-06-14 DIAGNOSIS — F0280 Dementia in other diseases classified elsewhere without behavioral disturbance: Secondary | ICD-10-CM

## 2014-06-14 DIAGNOSIS — E139 Other specified diabetes mellitus without complications: Secondary | ICD-10-CM

## 2014-06-20 ENCOUNTER — Encounter: Payer: Self-pay | Admitting: Internal Medicine

## 2014-06-20 ENCOUNTER — Ambulatory Visit: Admitting: Internal Medicine

## 2014-06-20 VITALS — BP 132/82 | HR 84 | Resp 20

## 2014-06-20 DIAGNOSIS — I6350 Cerebral infarction due to unspecified occlusion or stenosis of unspecified cerebral artery: Secondary | ICD-10-CM

## 2014-06-20 DIAGNOSIS — E1129 Type 2 diabetes mellitus with other diabetic kidney complication: Secondary | ICD-10-CM | POA: Diagnosis not present

## 2014-06-20 DIAGNOSIS — F0280 Dementia in other diseases classified elsewhere without behavioral disturbance: Secondary | ICD-10-CM | POA: Diagnosis not present

## 2014-06-20 DIAGNOSIS — I639 Cerebral infarction, unspecified: Secondary | ICD-10-CM | POA: Insufficient documentation

## 2014-06-20 DIAGNOSIS — G309 Alzheimer's disease, unspecified: Secondary | ICD-10-CM | POA: Diagnosis not present

## 2014-06-20 NOTE — Assessment & Plan Note (Signed)
Discussed the lack of need for the insulin now Husband would like to reasonably control sugars though He will cut down to 10 daily of lantus--then cut further if staying under 300-400

## 2014-06-20 NOTE — Progress Notes (Signed)
Subjective:    Patient ID: Sara Gilbert, female    DOB: [birth year], 68 y.o.   MRN: [id number]  HPI Husband here Inetta Fermo the hospice nurse is also here  Had been declining but then sudden downturn 4-5 days ago Bed only Not taking any meds--other than evening lantus and occ lorazepam (for moaning/restlessness). This has helped No longer communicating--no words in past 5 days Doesn't seem to be doing non verbal communication with husband either.  Not eating or drinking for the past 4 days May have taken 1-2 ounces total each of the past couple of days  Sugars are fluctuating--- from 120-382 Mostly under 200  Current Outpatient Prescriptions on File Prior to Visit  Medication Sig Dispense Refill  . glucose blood (ONE TOUCH TEST STRIPS) test strip Use as instructed to test blood sugar once daily dx: 250.40  100 each  1  . Infant Care Products (DERMACLOUD EX) Apply 1 application topically 3 (three) times daily as needed.      . Insulin Glargine (LANTUS SOLOSTAR) 100 UNIT/ML Solostar Pen INJECT 15 UNITS SUB-Q EVERY EVENING AT BEDTIME      . Insulin Pen Needle (PEN NEEDLES 31GX5/16") 31G X 8 MM MISC To use with lantus solostar pen. Dx 250.40  100 each  3   No current facility-administered medications on file prior to visit.    Allergies  Allergen Reactions  . Tetanus Toxoids Swelling and Rash    Past Medical History  Diagnosis Date  . Type II or unspecified type diabetes mellitus with neurological manifestations, not stated as uncontrolled   . Hypertension   . Type II or unspecified type diabetes mellitus with renal manifestations, not stated as uncontrolled   . Hyperlipidemia   . Alzheimer's dementia 2010?  Marland Kitchen Goiter   . Depression   . Osteoarthritis, hand   . Vitamin D deficiency     Normal DEXA 11/09    Past Surgical History  Procedure Laterality Date  . Tonsillectomy    . Cardiac catheterization      [facility]    Family History  Problem Relation Age of Onset  .  Dementia Mother   . Breast cancer Mother   . Hypertension Mother   . Coronary artery disease Father   . Hypertension Father   . Diabetes Father     History   Social History  . Marital Status: Married    Spouse Name: N/A    Number of Children: 1  . Years of Education: N/A   Occupational History  . 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 Smoking status: Former Smoker    Types: Cigarettes    Quit date: 09/21/1997  . Smokeless tobacco: Never Used  . Alcohol Use: No  . Drug Use: No  . Sexual Activity: Not on file   Other Topics Concern  . Not on file   Social History Narrative   Lives with her husband   Has living will--from many years ago   No specific health care POA---but requests husband then daughter   DNR done 08/09/13 ---husband feels this is her wish from prior discussions   Review of Systems Still making some urine--but not much No clear arthritis pain    Objective:   Physical Exam  Constitutional:  Wasted and not responsive  Cardiovascular: Normal rate, regular rhythm and normal heart sounds.  Exam reveals no gallop.   No murmur heard. Pulmonary/Chest: Effort normal and breath sounds normal. No respiratory  distress. She has no wheezes. She has no rales.  Clear anteriorly  Abdominal: Soft. There is no tenderness.  Musculoskeletal: She exhibits no edema.  Neurological:  Hypotonic extremities without active movement Left arm seems particularly flaccid Left eye ptosis--does slightly open during exam No facial droop          Assessment & Plan:

## 2014-06-20 NOTE — Assessment & Plan Note (Signed)
Probable cause of the severe change Explained that this is more common in those with Alzheimers Could also just be the end stage of her Alzheimers Discussed supportive care Roxanol for prn use Off all meds pretty much

## 2014-06-20 NOTE — Assessment & Plan Note (Signed)
Had declined to end stage but now actively dying from either the Alzheimer's or a possible stroke

## 2014-06-22 ENCOUNTER — Telehealth: Payer: Self-pay

## 2014-06-22 DIAGNOSIS — F0280 Dementia in other diseases classified elsewhere without behavioral disturbance: Secondary | ICD-10-CM

## 2014-06-22 DIAGNOSIS — G309 Alzheimer's disease, unspecified: Secondary | ICD-10-CM

## 2014-06-23 NOTE — Telephone Encounter (Signed)
This was certainly expected

## 2014-07-22 NOTE — Telephone Encounter (Signed)
[name] nurse with [facility] left v/m that pt passed away at home today at 1:50 PM. Sara Gilbert does not require cb.

## 2014-07-22 DEATH — deceased
